# Patient Record
Sex: Female | Born: 1950 | Race: White | Hispanic: No
Health system: Midwestern US, Academic
[De-identification: ages and names within clinical notes are randomized; demographics above are authoritative.]

---

## 2016-07-04 MED ORDER — AMLODIPINE 5 MG PO TAB
ORAL_TABLET | Freq: Every day | 0 refills | Status: DC
Start: 2016-07-04 — End: 2018-06-16

## 2017-06-26 ENCOUNTER — Encounter: Admit: 2017-06-26 | Discharge: 2017-06-27 | Payer: MEDICARE

## 2017-06-26 DIAGNOSIS — R69 Illness, unspecified: Principal | ICD-10-CM

## 2017-07-10 ENCOUNTER — Encounter: Admit: 2017-07-10 | Discharge: 2017-07-10 | Payer: MEDICARE

## 2017-07-10 DIAGNOSIS — R69 Illness, unspecified: Principal | ICD-10-CM

## 2017-12-02 ENCOUNTER — Encounter: Admit: 2017-12-02 | Discharge: 2017-12-02 | Payer: MEDICARE

## 2018-04-08 ENCOUNTER — Encounter: Admit: 2018-04-08 | Discharge: 2018-04-09 | Payer: MEDICARE

## 2018-04-08 IMAGING — CR LOW_EXM
3 series · 3 of 3 positions shown · non-contrast
Comparison: none

[foot]
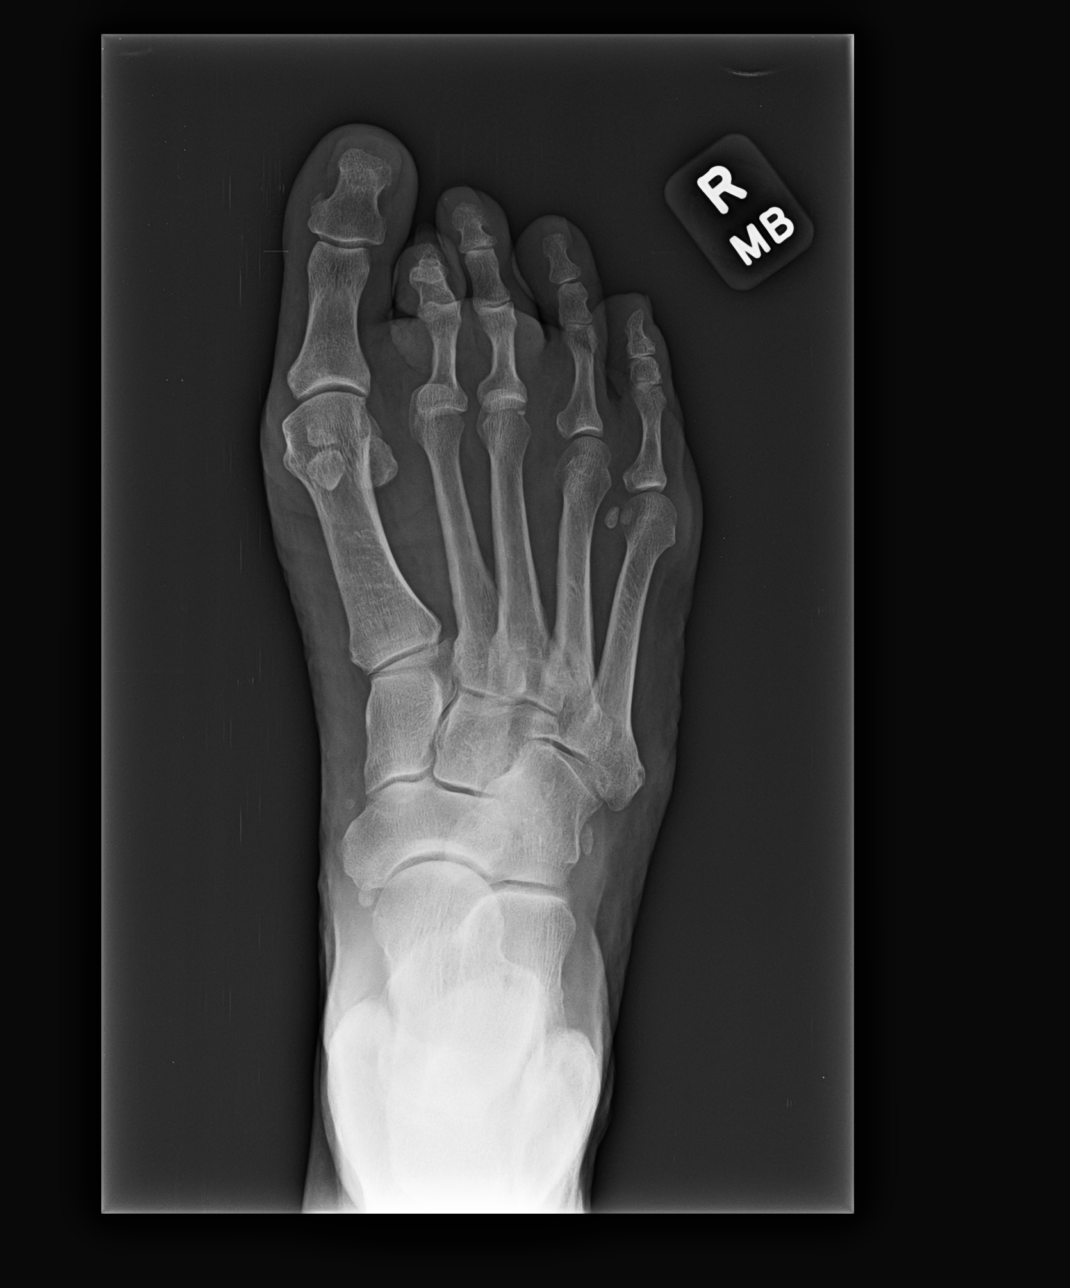

[foot obl]
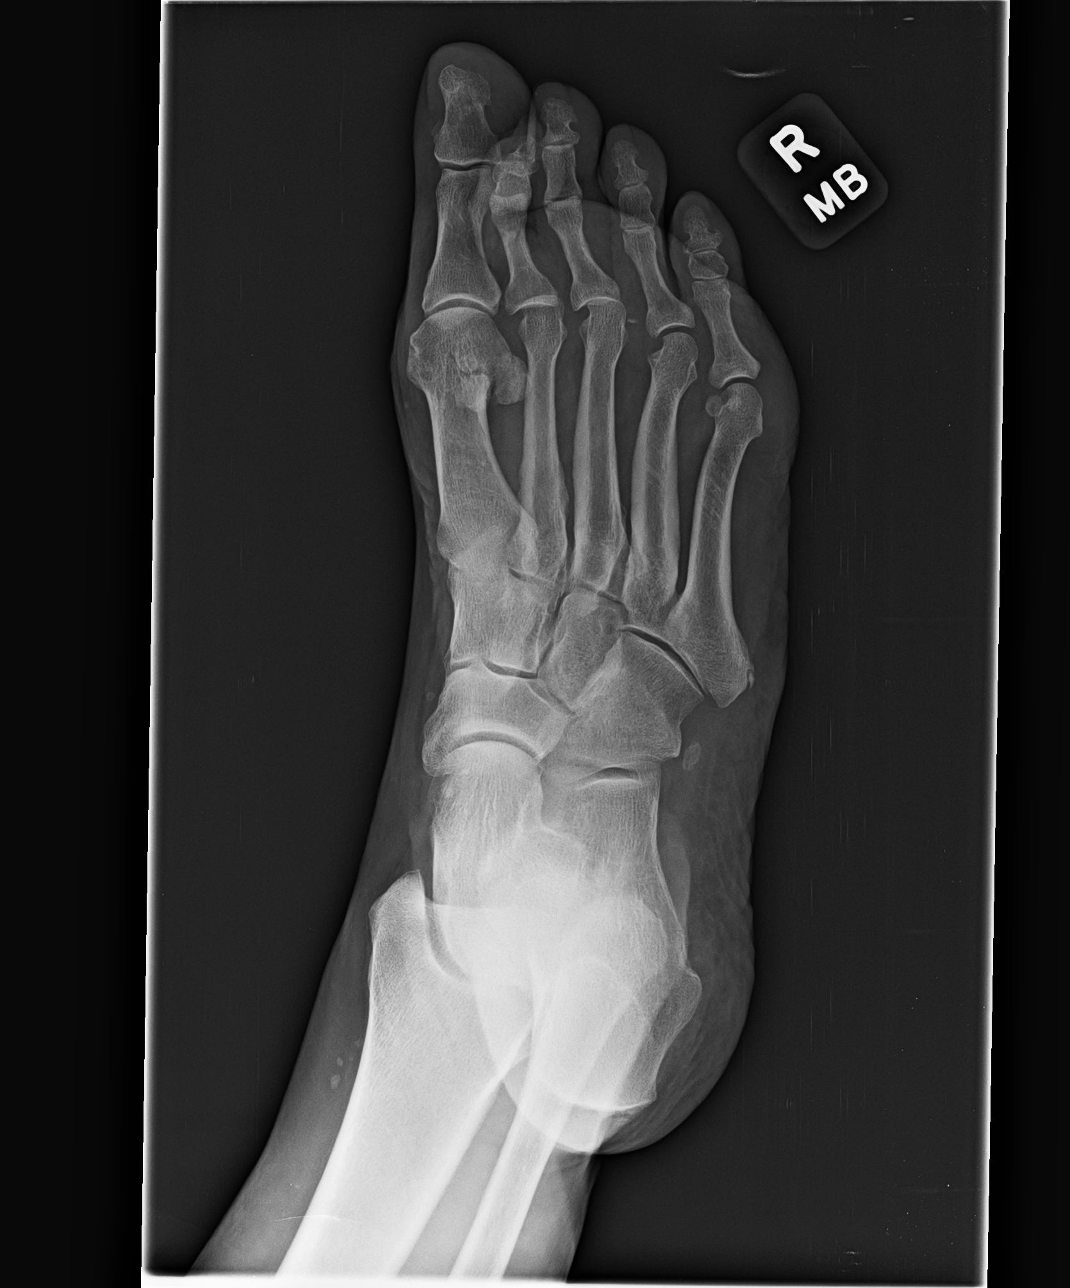

[foot lat]
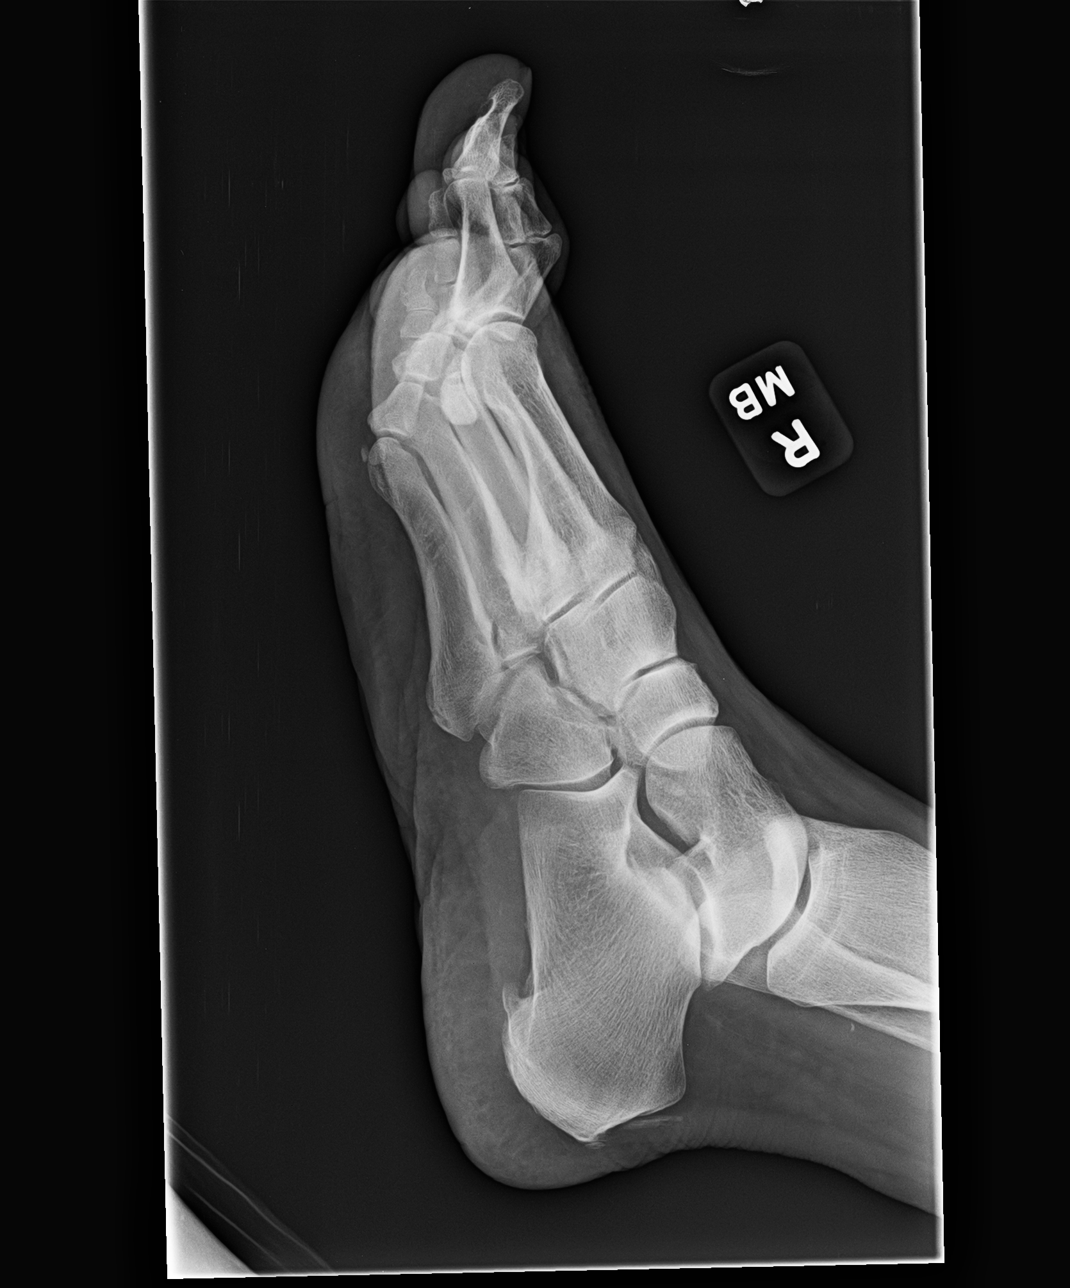

[3 of 3 positions shown; findings below may reference images not displayed]

EXAM
3 VIEW RIGHT FOOT

INDICATION
right foot pain
chronic right foot pain, hx of cortisone inj x1 year ago with noted toe changes since - MB/AK

TECHNIQUE
Frontal, oblique, and lateral radiographs of the right foot were submitted.

COMPARISONS
Right foot radiographs June 26, 2017.

FINDINGS
There is no acute fracture or malalignment. The joint spaces are preserved. No aggressive bone
lesion is identified. The Lisfranc interval is normal.Bipartite tibial sesamoid, bunionette, os
peroneum, an accessory navicular ossicle. Plantar and posterior calcaneal enthesophytes. Suggestion
of hammertoe deformities of the 2nd and 3rd toes on nonweightbearing imaging, correlate with
clinical exam.

IMPRESSION
1. No acute displaced fracture or malalignment.
2. Findings suggesting hammertoe deformities of 2nd and 3rd toes, correlate with clinical exam.
3. Calcaneal enthesophytes.

Tech Notes:

chronic right foot pain, hx of cortisone inj x1 year ago with noted toe changes since - MB/AK

## 2018-04-10 ENCOUNTER — Encounter: Admit: 2018-04-10 | Discharge: 2018-04-11 | Payer: MEDICARE

## 2018-04-22 ENCOUNTER — Encounter: Admit: 2018-04-22 | Discharge: 2018-04-22 | Payer: MEDICARE

## 2018-04-22 DIAGNOSIS — M79671 Pain in right foot: Principal | ICD-10-CM

## 2018-06-10 ENCOUNTER — Encounter: Admit: 2018-06-10 | Discharge: 2018-06-10 | Payer: MEDICARE

## 2018-06-16 ENCOUNTER — Ambulatory Visit: Admit: 2018-06-16 | Discharge: 2018-06-17 | Payer: MEDICARE

## 2018-06-16 NOTE — Progress Notes
???   ABSCESS DRAINAGE     ??? COLONOSCOPY     ??? HX TONSILLECTOMY     ??? HYSTERECTOMY         Social History     Socioeconomic History   ??? Marital status: Widowed     Spouse name: Not on file   ??? Number of children: 1   ??? Years of education: Not on file   ??? Highest education level: Not on file   Occupational History   ??? Occupation: retired    Tobacco Use   ??? Smoking status: Current Every Day Smoker     Packs/day: 1.00     Types: Cigarettes   ??? Smokeless tobacco: Never Used   Substance and Sexual Activity   ??? Alcohol use: No     Alcohol/week: 0.0 standard drinks   ??? Drug use: No   ??? Sexual activity: Not on file   Other Topics Concern   ??? Not on file   Social History Narrative   ??? Not on file       Family History   Problem Relation Age of Onset   ??? Parkinson's  Mother    ??? Heart problem Mother    ??? Alzheimer's Father    ??? Diabetes Sister    ??? Heart problem Brother    ??? Aneurysm Brother        Allergies:   Allergies   Allergen Reactions   ??? Pcn [Penicillins] HIVES   ??? Succinylcholine SEE COMMENTS     doesn't wake up with antidote         Outpatient Medications as of 06/16/2018   Medication Sig Dispense Refill   ??? escitalopram oxalate (LEXAPRO) 10 mg tablet Take 10 mg by mouth daily.     ??? HYDROcodone/acetaminophen (NORCO) 5/325 mg tablet Take 1 Tab by mouth as Needed for Pain     ??? LORazepam (ATIVAN) 0.5 mg tablet Take 1 Tab by mouth as Needed (Anxiety).     ??? meloxicam (MOBIC) 15 mg tablet Take 15 mg by mouth daily.     ??? omeprazole DR(+) (PRILOSEC) 20 mg capsule Take 20 mg by mouth daily before breakfast.           Review of Systems:  Musculoskeletal:positive for bone pain  Cardio: denies chest pain  Respiratory: denies shortness of breath     There were no vitals taken for this visit.    Based on my limited viewing of her foot she has hyperextension at the level of the MTP.  There is what looks to be a callus on the plantar aspect.  She is got some hammering of her second and third toes.

## 2018-06-17 DIAGNOSIS — M2061 Acquired deformities of toe(s), unspecified, right foot: Principal | ICD-10-CM

## 2018-06-18 ENCOUNTER — Encounter: Admit: 2018-06-18 | Discharge: 2018-06-18 | Payer: MEDICARE

## 2018-06-18 DIAGNOSIS — M2061 Acquired deformities of toe(s), unspecified, right foot: Principal | ICD-10-CM

## 2018-06-18 DIAGNOSIS — M79671 Pain in right foot: Principal | ICD-10-CM

## 2018-06-18 NOTE — Telephone Encounter
Ariel Lee agreed for surgery (right 2nd & 3rd head metatarsal head resection with RTS implants and possible 2 & 3rd PIP) to be scheduled for 06/24/2018. Surgery packet sent to e-mail [email or web address] with instructions outlined for pt's review. She was informed to bring crutches, walker or an ambulatory assistive device to surgery. This nurse discussed the need for a driver that will not be able to wait in the facility d/t COVID-19 protocol and the need to be tested on 06/22/2018 and have a negative result in order to proceed with surgery. She verbalized understanding. Ariel Lee was given the number to the COVID-19 81600 to schedule for testing.

## 2018-06-22 ENCOUNTER — Encounter: Admit: 2018-06-22 | Discharge: 2018-06-22 | Payer: MEDICARE

## 2018-06-22 ENCOUNTER — Ambulatory Visit: Admit: 2018-06-22 | Discharge: 2018-06-23 | Payer: MEDICARE

## 2018-06-22 DIAGNOSIS — F419 Anxiety disorder, unspecified: ICD-10-CM

## 2018-06-22 DIAGNOSIS — F329 Major depressive disorder, single episode, unspecified: Principal | ICD-10-CM

## 2018-06-22 DIAGNOSIS — Z01818 Encounter for other preprocedural examination: ICD-10-CM

## 2018-06-22 DIAGNOSIS — M1990 Unspecified osteoarthritis, unspecified site: ICD-10-CM

## 2018-06-23 ENCOUNTER — Encounter: Admit: 2018-06-23 | Discharge: 2018-06-23 | Payer: MEDICARE

## 2018-06-23 ENCOUNTER — Ambulatory Visit: Admit: 2018-06-23 | Discharge: 2018-06-23 | Payer: MEDICARE

## 2018-06-23 DIAGNOSIS — M79671 Pain in right foot: Principal | ICD-10-CM

## 2018-06-23 DIAGNOSIS — Z1159 Encounter for screening for other viral diseases: ICD-10-CM

## 2018-06-23 LAB — COVID-19 (SARS-COV-2) PCR

## 2018-06-23 NOTE — Telephone Encounter
Called patient and verified full name and date of birth. Informed patient of NEGATIVE COVID 19 testing. Patient was asked if they were still experiencing any symptoms and responded with no symptoms Patient was advised to contact PCP for ongoing symptoms. Pre-op testing. Patient had no further questions.

## 2018-06-24 ENCOUNTER — Encounter: Admit: 2018-06-24 | Discharge: 2018-06-24 | Payer: MEDICARE

## 2018-06-24 ENCOUNTER — Encounter: Admit: 2018-06-24 | Discharge: 2018-06-25 | Payer: MEDICARE

## 2018-06-24 ENCOUNTER — Ambulatory Visit: Admit: 2018-06-24 | Discharge: 2018-06-24 | Payer: MEDICARE

## 2018-06-24 DIAGNOSIS — Z880 Allergy status to penicillin: ICD-10-CM

## 2018-06-24 DIAGNOSIS — Z7982 Long term (current) use of aspirin: ICD-10-CM

## 2018-06-24 DIAGNOSIS — Z79899 Other long term (current) drug therapy: ICD-10-CM

## 2018-06-24 DIAGNOSIS — F419 Anxiety disorder, unspecified: ICD-10-CM

## 2018-06-24 DIAGNOSIS — M2041 Other hammer toe(s) (acquired), right foot: ICD-10-CM

## 2018-06-24 DIAGNOSIS — F17210 Nicotine dependence, cigarettes, uncomplicated: ICD-10-CM

## 2018-06-24 DIAGNOSIS — F329 Major depressive disorder, single episode, unspecified: ICD-10-CM

## 2018-06-24 DIAGNOSIS — M19071 Primary osteoarthritis, right ankle and foot: ICD-10-CM

## 2018-06-24 DIAGNOSIS — Z791 Long term (current) use of non-steroidal anti-inflammatories (NSAID): ICD-10-CM

## 2018-06-24 DIAGNOSIS — S93124A Dislocation of metatarsophalangeal joint of right lesser toe(s), initial encounter: Principal | ICD-10-CM

## 2018-06-24 DIAGNOSIS — M1990 Unspecified osteoarthritis, unspecified site: ICD-10-CM

## 2018-06-24 MED ORDER — CEFAZOLIN 1 GRAM IJ SOLR
0 refills | Status: DC
Start: 2018-06-24 — End: 2018-06-24
  Administered 2018-06-24: 15:00:00 2 g via INTRAVENOUS

## 2018-06-24 MED ORDER — MEPIVACAINE 2 % IJ SOLN
0 refills | Status: DC
Start: 2018-06-24 — End: 2018-06-24
  Administered 2018-06-24: 14:00:00 10 mL
  Administered 2018-06-24: 14:00:00 5 mL

## 2018-06-24 MED ORDER — PROPOFOL 10 MG/ML IV EMUL 20 ML (INFUSION)(AM)(OR)
INTRAVENOUS | 0 refills | Status: DC
Start: 2018-06-24 — End: 2018-06-24
  Administered 2018-06-24: 15:00:00 60 ug via INTRAVENOUS
  Administered 2018-06-24: 14:00:00 50 ug/kg/min via INTRAVENOUS

## 2018-06-24 MED ORDER — DEXAMETHASONE SODIUM PHOSPHATE 10 MG/ML IJ SOLN
0 refills | Status: CP
Start: 2018-06-24 — End: ?
  Administered 2018-06-24: 14:00:00 2 mg

## 2018-06-24 MED ORDER — LACTATED RINGERS IV SOLP
INTRAVENOUS | 0 refills | Status: DC
Start: 2018-06-24 — End: 2018-06-24
  Administered 2018-06-24: 13:00:00 1000.000 mL via INTRAVENOUS

## 2018-06-24 MED ORDER — HALOPERIDOL LACTATE 5 MG/ML IJ SOLN
1 mg | Freq: Once | INTRAVENOUS | 0 refills | Status: DC | PRN
Start: 2018-06-24 — End: 2018-06-24

## 2018-06-24 MED ORDER — HYDROCODONE-ACETAMINOPHEN 5-325 MG PO TAB
1-2 | ORAL_TABLET | ORAL | 0 refills | 15.00000 days | Status: DC | PRN
Start: 2018-06-24 — End: 2018-07-14
  Filled 2018-06-24 (×2): qty 50, 7d supply, fill #1

## 2018-06-24 MED ORDER — BUPIVACAINE 0.5 % (5 MG/ML) IJ SOLN
0 refills | Status: CP
Start: 2018-06-24 — End: ?
  Administered 2018-06-24: 14:00:00 10 mL

## 2018-06-24 MED ORDER — OXYCODONE 5 MG PO TAB
5-10 mg | Freq: Once | ORAL | 0 refills | Status: DC | PRN
Start: 2018-06-24 — End: 2018-06-24

## 2018-06-24 MED ORDER — LIDOCAINE (PF) 10 MG/ML (1 %) IJ SOLN
.1-2 mL | INTRAMUSCULAR | 0 refills | Status: DC | PRN
Start: 2018-06-24 — End: 2018-06-24

## 2018-06-24 MED ORDER — FENTANYL CITRATE (PF) 50 MCG/ML IJ SOLN
25 ug | INTRAVENOUS | 0 refills | Status: DC | PRN
Start: 2018-06-24 — End: 2018-06-24

## 2018-06-24 MED ORDER — ACETAMINOPHEN 500 MG PO TAB
1000 mg | Freq: Once | ORAL | 0 refills | Status: CP
Start: 2018-06-24 — End: ?
  Administered 2018-06-24: 16:00:00 1000 mg via ORAL

## 2018-06-24 MED ORDER — HYDROMORPHONE (PF) 2 MG/ML IJ SYRG
1 mg | INTRAVENOUS | 0 refills | Status: DC | PRN
Start: 2018-06-24 — End: 2018-06-24

## 2018-06-24 MED ORDER — PROMETHAZINE 25 MG/ML IJ SOLN
6.25 mg | INTRAVENOUS | 0 refills | Status: DC | PRN
Start: 2018-06-24 — End: 2018-06-24

## 2018-06-24 MED ORDER — ONDANSETRON 4 MG PO TBDI
4-8 mg | ORAL_TABLET | ORAL | 1 refills | 8.00000 days | Status: DC | PRN
Start: 2018-06-24 — End: 2019-06-29
  Filled 2018-06-24 (×2): qty 30, 5d supply, fill #1

## 2018-06-24 MED ORDER — FENTANYL CITRATE (PF) 50 MCG/ML IJ SOLN
0 refills | Status: DC
Start: 2018-06-24 — End: 2018-06-24
  Administered 2018-06-24 (×2): 25 ug via INTRAVENOUS

## 2018-06-24 MED ORDER — BUPIVACAINE 0.5 % (5 MG/ML) IJ SOLN
0 refills | Status: CP
Start: 2018-06-24 — End: ?
  Administered 2018-06-24: 14:00:00 5 mL

## 2018-06-24 MED ORDER — BUPIVACAINE 0.5 % (5 MG/ML) IJ SOLN
0 refills | Status: DC
Start: 2018-06-24 — End: 2018-06-24

## 2018-06-24 MED ORDER — MIDAZOLAM 1 MG/ML IJ SOLN
INTRAVENOUS | 0 refills | Status: DC
Start: 2018-06-24 — End: 2018-06-24
  Administered 2018-06-24 (×2): 1 mg via INTRAVENOUS

## 2018-06-24 MED ORDER — MEPERIDINE (PF) 25 MG/ML IJ SYRG
12.5 mg | INTRAVENOUS | 0 refills | Status: DC | PRN
Start: 2018-06-24 — End: 2018-06-24

## 2018-06-24 MED ORDER — FENTANYL CITRATE (PF) 50 MCG/ML IJ SOLN
50 ug | INTRAVENOUS | 0 refills | Status: DC | PRN
Start: 2018-06-24 — End: 2018-06-24

## 2018-06-25 NOTE — Progress Notes
PATIENT HAS CALL INTO DR OFFICE R/T NORCO MAY NOT BE STRONG ENOUGH AND SURGICAL DRESSING FELL OFF DURING THE NIGHT. SHE PUT THE DRSG BACK ON AS BEST AS SHE COULD

## 2018-06-26 ENCOUNTER — Encounter: Admit: 2018-06-26 | Discharge: 2018-06-26 | Payer: MEDICARE

## 2018-06-26 DIAGNOSIS — F419 Anxiety disorder, unspecified: ICD-10-CM

## 2018-06-26 DIAGNOSIS — M1990 Unspecified osteoarthritis, unspecified site: ICD-10-CM

## 2018-06-26 DIAGNOSIS — F329 Major depressive disorder, single episode, unspecified: Principal | ICD-10-CM

## 2018-06-26 NOTE — Other
Brief Operative Note    Name: Ariel Lee is a 68 y.o. 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     Specimen(s) Removed/Disposition: * No specimens in log *    Complications:  None    Implants: size 2 RTS implants, acutwist x 2    Drains: None    Disposition:  PACU - stable    Ree Shay, MD  Pager [phone]

## 2018-06-30 ENCOUNTER — Ambulatory Visit: Admit: 2018-06-30 | Discharge: 2018-07-01 | Payer: MEDICARE

## 2018-06-30 ENCOUNTER — Encounter: Admit: 2018-06-30 | Discharge: 2018-06-30 | Payer: MEDICARE

## 2018-06-30 DIAGNOSIS — M1990 Unspecified osteoarthritis, unspecified site: ICD-10-CM

## 2018-06-30 DIAGNOSIS — F329 Major depressive disorder, single episode, unspecified: Principal | ICD-10-CM

## 2018-06-30 DIAGNOSIS — F419 Anxiety disorder, unspecified: ICD-10-CM

## 2018-07-01 DIAGNOSIS — Z09 Encounter for follow-up examination after completed treatment for conditions other than malignant neoplasm: Principal | ICD-10-CM

## 2018-07-14 ENCOUNTER — Encounter: Admit: 2018-07-14 | Discharge: 2018-07-14 | Payer: MEDICARE

## 2018-07-14 DIAGNOSIS — M1990 Unspecified osteoarthritis, unspecified site: ICD-10-CM

## 2018-07-14 DIAGNOSIS — F419 Anxiety disorder, unspecified: ICD-10-CM

## 2018-07-14 DIAGNOSIS — F329 Major depressive disorder, single episode, unspecified: Principal | ICD-10-CM

## 2018-07-15 ENCOUNTER — Ambulatory Visit: Admit: 2018-07-14 | Discharge: 2018-07-15 | Payer: MEDICARE

## 2018-07-15 DIAGNOSIS — Z09 Encounter for follow-up examination after completed treatment for conditions other than malignant neoplasm: Principal | ICD-10-CM

## 2018-08-11 ENCOUNTER — Encounter: Admit: 2018-08-11 | Discharge: 2018-08-11

## 2018-08-12 ENCOUNTER — Encounter: Admit: 2018-08-12 | Discharge: 2018-08-12

## 2018-08-12 MED ORDER — CEPHALEXIN 500 MG PO CAP
500 mg | ORAL_CAPSULE | Freq: Four times a day (QID) | ORAL | 0 refills | Status: DC
Start: 2018-08-12 — End: 2019-03-09

## 2018-08-12 NOTE — Telephone Encounter
Received call from Ms. Detlefsen regarding her 2nd toe on right foot. She report redness and swelling. She denies fever and chills. Keflex 500mg  qid x10 days e-scripted per protocol. She was instructed to return call in 3-4 days if swelling/redness does not improve or if she starts to experience fever and or chills. She verbalized understanding.

## 2018-08-24 ENCOUNTER — Encounter: Admit: 2018-08-24 | Discharge: 2018-08-24

## 2018-08-24 NOTE — Progress Notes
08/24/2018 5:13 PM Received historical labs from [facility].  Entered into EMR.

## 2018-08-25 ENCOUNTER — Encounter: Admit: 2018-08-25 | Discharge: 2018-08-25

## 2018-08-25 ENCOUNTER — Ambulatory Visit: Admit: 2018-08-25 | Discharge: 2018-08-26

## 2018-08-25 DIAGNOSIS — F329 Major depressive disorder, single episode, unspecified: Secondary | ICD-10-CM

## 2018-08-25 DIAGNOSIS — R6884 Jaw pain: Secondary | ICD-10-CM

## 2018-08-25 DIAGNOSIS — F419 Anxiety disorder, unspecified: Secondary | ICD-10-CM

## 2018-08-25 DIAGNOSIS — I10 Essential (primary) hypertension: Secondary | ICD-10-CM

## 2018-08-25 DIAGNOSIS — R011 Cardiac murmur, unspecified: Secondary | ICD-10-CM

## 2018-08-25 DIAGNOSIS — R0600 Dyspnea, unspecified: Secondary | ICD-10-CM

## 2018-08-25 DIAGNOSIS — M1990 Unspecified osteoarthritis, unspecified site: Secondary | ICD-10-CM

## 2018-08-25 NOTE — Patient Instructions
Taking Your Blood Pressure  Blood pressure is the force of blood against the artery wall as it moves from the heart through the blood vessels. You can take your own blood pressure reading using a digital monitor. Take your readings the same each time, using the same arm. Take readings as often as your healthcare provider advises.  About blood pressure monitors  Blood pressure monitors are designed for certain ages and cases. You can find monitors for older adults, for pregnant women, and for children. Make sure the one you choose is the right one for your age and situation.  The American Heart Association advises an automatic cuff monitor that fits on your upper arm (bicep). The cuff should fit your arm size. A cuff that???s too large or too small will not give an accurate reading. Measure around your upper arm to find your size.  Monitors that attach to your finger or wrist are not as accurate as monitors for your upper arm.  Ask your healthcare provider for help in choosing a monitor. Bring your monitor to your next provider visit if you need help in using it the correct way.  The steps below are general instructions for using an automatic digital monitor.  Step 1. Relax    ??? Take your blood pressure at the same time every day, such as in the morning or evening. Or take it at the time your healthcare provider advises.  ??? Wait at least30 minutes after smoking, eating, or exercising. Don't drink coffee, tea, soda, or other caffeinated drinks before checking your blood pressure. If needed, use the restroom beforehand.  ??? Sit comfortably at a table with both feet on the floor. Don't cross your legs or feet. Place the monitor near you.  ??? Rest for a few minutes before you begin. Make sure there are no distractions. This includes TV, cell phones, and other electronics. Wait to have conversations with others until after you measure you blood pressure.  Step 2. Wrap the cuff ??? Place your arm on the table, palm up. Your arm should be at the level of your heart. Wrap the cuff around your upper arm, just above your elbow. It???s best done on bare skin, not over clothing. Most cuffs will show you where the blood vessel in the middle of the arm at the inner side of the elbow (the brachial artery) should line up with the cuff. Look in your monitor's instruction booklet for an illustration. You can also bring your cuff to your healthcare provider and have them show you how to correctly place the cuff.  Step 3. Inflate the cuff    ??? Push the button that starts the pump.  ??? The cuff will tighten, then loosen.  ??? The numbers will change. When they stop changing, your blood pressure reading will appear.  ??? Take 2 or 3 readings 1 minute apart.  Step 4. Write down the results of each reading    ??? Write down your blood pressure numbers for each reading. Note the date and time. Keep your results in one place, such as a notebook. Even if your monitor has a built-in memory, keep a hard copy of the readings.  ??? Remove the cuff from your arm. Turn off the machine.  ??? Bring your blood pressure records with you to each healthcare provider visit.  ??? If you start a new blood pressure medicine, note the day you started the new medicine. Also note the day if you change the dose  of your medicine. Measure your blood pressure before your take your medicine. This information goes on your blood pressure recording sheet. This will help your healthcare provider check how well the medicine changes are working.  ??? Ask your provider what numbers mean that you should call him or her. Also ask what numbers mean you should get help right away.  [employer] last reviewed this educational content on 08/18/2017  ??? 2000-2020 [employer], [place]. [address] [place], [place] [address]. All rights reserved. This information is not intended as a substitute for professional medical care. Always follow your healthcare professional's instructions.  Follow up as directed.    Call sooner if issues.    Call the [facility] nursing line at [phone].    Leave a detailed message for the nurse in Ariel Lee/[place] with how we can assist you and we will call you back.

## 2018-08-25 NOTE — Progress Notes
Date of Service: 08/25/2018    Ariel Lee is a 68 y.o. female.       HPI     Ariel Lee has been followed for hypertension.  She reports being told that her blood pressure is elevated when she sees a physician.  However, her blood pressure is very well controlled in the office today.  She has had several orthopedic procedures in the past year.  On Jun 24, 2018 she underwent right 2nd metatarsal head excision., right 3rd metatarsal head excision and right 2nd proximal interphalangeal joint fusion for correction of  right foot dislocated 2nd and 3rd metatarsophalangeal joints, metatarsophalangeal arthrosis, and 2nd hammertoe deformity.  She appears to have some infection near her right second toe is currently receiving Keflex with improvement.  In January 2020 she underwent left knee arthroplasty and in February 2020 she underwent right knee arthroplasty.  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  Otherwise from a cardiovascular perspective, the patient has been stable and reports no angina, congestive symptoms, palpitations, sensation of sustained forceful heart pounding, lightheadedness or syncope. Her exercise tolerance has been stable. The patient reports no myalgias, bleeding abnormalities, neurologic motor abnormalities or difficulty with speech.        Vitals:    08/25/18 1345 08/25/18 1406 08/25/18 1407   BP:  108/76 108/76   BP Source:  Arm, Left Upper Arm, Right Upper   Pulse:  86    SpO2:  97%    Weight:  97.4 kg (214 lb 12.8 oz)    Height:  1.676 m (5' 6)    PainSc: Zero Zero      Body mass index is 34.67 kg/m???.     Past Medical History  Patient Active Problem List    Diagnosis Date Noted   ??? Poorly-controlled hypertension 05/16/2015   ??? Dyspnea 05/16/2015   ??? Jaw pain 05/16/2015   ??? Tobacco abuse 05/16/2015   ??? Osteoarthritis of spine with radiculopathy, cervical region 05/04/2015 Review of Systems   Constitution: Negative.   HENT: Negative.    Eyes: Positive for blurred vision and vision loss in left eye.   Cardiovascular: Negative.    Respiratory: Positive for snoring.    Endocrine: Negative.    Hematologic/Lymphatic: Negative.    Skin: Negative.    Musculoskeletal: Positive for arthritis.   Gastrointestinal: Negative.    Genitourinary: Negative.    Neurological: Negative.    Psychiatric/Behavioral: Negative.    Allergic/Immunologic: Negative.        Physical Exam  GENERAL: The patient is well developed, well nourished, resting comfortably and in no distress.   HEENT: No abnormalities of the visible oro-nasopharynx, conjunctiva or sclera are noted.  NECK: There is no jugular venous distension. Carotids are palpable and without bruits. There is no thyroid enlargement.  Chest: Lung fields are clear to auscultation. There are no wheezes or crackles.  CV: There is a regular rhythm. The first and second heart sounds are normal.  A grade 3/6 systolic ejection murmur is heard loudest in the right upper sternal border.  There are no diastolic murmurs, gallops or rubs.  ABD: The abdomen is soft and supple with normal bowel sounds. There is no hepatosplenomegaly, ascites, tenderness, masses or bruits.  Neuro: There are no focal motor defects. Ambulation is normal. Cognitive function appears normal.  Ext: There is no edema or evidence of deep vein thrombosis. Peripheral pulses  are satisfactory.  Improving incision near the right second metatarsal.  SKIN: There are no rashes and no cellulitis  PSYCH: The patient is calm, rationale and oriented.    Cardiovascular Studies  A twelve-lead ECG was obtained on 08/25/2018 and reveals normal sinus rhythm with a heart rate of 81 bpm.  Multiple premature supraventricular and ventricular ectopic beats are noted.  There is no evidence of myocardial ischemia or infarction.    Problems Addressed Today  Hypertension.  Systolic murmur.    Assessment and Plan Ariel Lee reports episodic hypertension.  However, is difficult for me to treat her with her blood pressure low today.  She was instructed to obtain a [employer] blood pressure cuff and to monitor her blood pressure daily. I have asked the patient to keep a log book of her BP readings and to report systolic BP readings exceeding 140 mm Hg.  I have asked her to obtain an echo Doppler study for further evaluation of her systolic murmur. I have asked her to return for follow-up in 3 months time to review her blood pressure readings.         Current Medications (including today's revisions)  ??? aspirin EC 81 mg tablet Take 81 mg by mouth at bedtime daily. Take with food.    ??? cephalexin (KEFLEX) 500 mg capsule Take one capsule by mouth four times daily.   ??? escitalopram oxalate (LEXAPRO) 10 mg tablet Take 10 mg by mouth at bedtime daily.   ??? LORazepam (ATIVAN) 0.5 mg tablet Take 1 Tab by mouth as Needed (Anxiety).   ??? meloxicam (MOBIC) 15 mg tablet Take 15 mg by mouth at bedtime daily.   ??? omeprazole DR(+) (PRILOSEC) 20 mg capsule Take 20 mg by mouth at bedtime daily.   ??? ondansetron (ZOFRAN ODT) 4 mg rapid dissolve tablet Dissolve one tablet to two tablets by mouth every 8 hours as needed for Nausea or Vomiting. Place on tongue to dissolve.

## 2018-09-09 ENCOUNTER — Ambulatory Visit: Admit: 2018-09-09 | Discharge: 2018-09-10

## 2018-09-09 ENCOUNTER — Encounter: Admit: 2018-09-09 | Discharge: 2018-09-09

## 2018-09-09 DIAGNOSIS — R0600 Dyspnea, unspecified: Secondary | ICD-10-CM

## 2018-09-09 DIAGNOSIS — R011 Cardiac murmur, unspecified: Secondary | ICD-10-CM

## 2018-09-09 DIAGNOSIS — I10 Essential (primary) hypertension: Secondary | ICD-10-CM

## 2018-09-09 DIAGNOSIS — R6884 Jaw pain: Secondary | ICD-10-CM

## 2018-09-10 ENCOUNTER — Encounter: Admit: 2018-09-10 | Discharge: 2018-09-10

## 2018-09-10 NOTE — Telephone Encounter
-----   Message from Nehemiah Massed, MD sent at 09/10/2018  9:34 AM CDT -----  Sherlon Handing: Ms. Collister's echo looks good.  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  Please let her know.  Thanks.  SBG  ----- Message -----  From: Nehemiah Massed, MD  Sent: 09/10/2018   9:18 AM CDT  To: Nehemiah Massed, MD

## 2018-09-10 NOTE — Telephone Encounter
Results and recommendations called to patient.

## 2018-12-08 ENCOUNTER — Encounter: Admit: 2018-12-08 | Discharge: 2018-12-08 | Payer: MEDICARE

## 2018-12-08 DIAGNOSIS — F329 Major depressive disorder, single episode, unspecified: Secondary | ICD-10-CM

## 2018-12-08 DIAGNOSIS — I10 Essential (primary) hypertension: Secondary | ICD-10-CM

## 2018-12-08 DIAGNOSIS — R0600 Dyspnea, unspecified: Secondary | ICD-10-CM

## 2018-12-08 DIAGNOSIS — M1990 Unspecified osteoarthritis, unspecified site: Secondary | ICD-10-CM

## 2018-12-08 DIAGNOSIS — F419 Anxiety disorder, unspecified: Secondary | ICD-10-CM

## 2018-12-08 LAB — BASIC METABOLIC PANEL
Lab: 0.8
Lab: 104
Lab: 137
Lab: 22 — ABNORMAL HIGH (ref 9.8–20.1)
Lab: 23
Lab: 4.5

## 2018-12-08 MED ORDER — AMLODIPINE 2.5 MG PO TAB
2.5 mg | ORAL_TABLET | Freq: Every day | ORAL | 3 refills | Status: AC
Start: 2018-12-08 — End: ?

## 2018-12-08 NOTE — Progress Notes
Date of Service: 12/08/2018    Ariel Lee is a 68 y.o. female.       HPI     Ariel Lee has been followed for hypertension.  Her systolic blood pressures are currently running 130 to 140 mmHg. 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  Otherwise from a cardiovascular perspective, the patient has been stable and reports no angina, congestive symptoms, palpitations, sensation of sustained forceful heart pounding, lightheadedness or syncope. Her exercise tolerance has been stable, although she does not have a regular exercise program. The patient reports no myalgias, bleeding abnormalities, neurologic motor abnormalities or difficulty with speech.   Ariel Lee reports that she has been intolerant of several statin medications in the past due to myalgias.  Historically, Ariel Lee had several orthopedic procedures in 2020.  In January 2020 she underwent left knee arthroplasty and in February 2020 she underwent right knee arthroplasty.  On Jun 24, 2018 she underwent right 2nd metatarsal head excision., right 3rd metatarsal head excision and right 2nd proximal interphalangeal joint fusion for correction of  right foot dislocated 2nd and 3rd metatarsophalangeal joints, metatarsophalangeal arthrosis, and 2nd hammertoe deformity.        Vitals:    12/08/18 1311 12/08/18 1325   BP: 138/88 136/86   BP Source: Arm, Left Upper Arm, Right Upper   Pulse: 80    Temp: 36.8 ?C (98.2 ?F)    SpO2: 98%    Weight: 96 kg (211 lb 9.6 oz)    Height: 1.676 m (5' 6)    PainSc: Zero      Body mass index is 34.15 kg/m?[name]     Past Medical History  Patient Active Problem List    Diagnosis Date Noted   ? Poorly-controlled hypertension 05/16/2015   ? Dyspnea 05/16/2015   ? Jaw pain 05/16/2015   ? Tobacco abuse 05/16/2015   ? 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   HENT: Negative.    Eyes: Negative.    Cardiovascular: Negative.    Respiratory: Negative.    Endocrine: Negative.    Hematologic/Lymphatic: Negative.    Skin: Negative.    Musculoskeletal: Negative.    Gastrointestinal: Negative.    Genitourinary: Negative.    Neurological: Negative.    Psychiatric/Behavioral: Negative.    Allergic/Immunologic: Negative.        Physical Exam  GENERAL: The patient is well developed, well nourished, resting comfortably and in no distress.   HEENT: No abnormalities of the visible oro-nasopharynx, conjunctiva or sclera are noted.  NECK: There is no jugular venous distension. Carotids are palpable and without bruits. There is no thyroid enlargement.  Chest: Lung fields are clear to auscultation. There are no wheezes or crackles.  CV: There is a regular rhythm. The first and second heart sounds are normal.  A grade 3/6 systolic ejection murmur is heard loudest in the right upper sternal border.  There are no diastolic murmurs, gallops or rubs.  Her apical heart rate is 80 bpm.  ABD: The abdomen is soft and supple with normal bowel sounds. There is no hepatosplenomegaly, ascites, tenderness, masses or bruits.  Neuro: There are no focal motor defects. Ambulation is normal. Cognitive function appears normal.  Ext: There is no edema or evidence of deep vein thrombosis. Peripheral pulses are satisfactory.  Improving incision near  the right second metatarsal.  SKIN: There are no rashes and no cellulitis  PSYCH: The patient is calm, rationale and oriented.    Cardiovascular Studies  A twelve-lead ECG was obtained on 08/25/2018 and reveals normal sinus rhythm with a heart rate of 81 bpm.  Multiple premature supraventricular and ventricular ectopic beats are noted.  There is no evidence of myocardial ischemia or infarction.    Echo Doppler 09/09/2018:  1. No regional wall motion abnormalities are seen. Overall LV systolic function appears normal and dynamic. The estimated left ventricular ejection fraction is 65%.   2. Normal left ventricular diastolic function.   3. Right ventricular contractility appears normal.  4. Normal atrial chamber dimensions.  5. 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 There is no evidence of significant valvular regurgitation or stenosis by doppler exam.  6. No pericardial effusion is seen.    Labs from 02/09/2018 revealed total cholesterol 236, triglycerides 125, HDL 47 LDL cholesterol 172 mg/dL.    Problems Addressed Today  Encounter Diagnoses   Name Primary?   ? Poorly-controlled hypertension Yes   ? Dyspnea, unspecified type        Assessment and Plan     Ariel Lee has mild hypertension.  Alternatives for treatment were reviewed with the patient and she wanted to start amlodipine 2.5 mg daily.  She reports that she has taken this medication before without adverse effects. I have asked the patient to keep a log book of her BP readings and to report BP readings exceeding 130/80 mm Hg. Regular mild aerobic exercise, weight loss and adherence to a heart healthy diet were recommended. The patient was given a requisition to check her Chem 7.  I have asked her to return for follow-up in 3 months to review her blood pressure readings.          Current Medications (including today's revisions)  ? amLODIPine (NORVASC) 2.5 mg tablet Take one tablet by mouth daily.   ? aspirin EC 81 mg tablet Take 81 mg by mouth at bedtime daily. Take with food.    ? cephalexin (KEFLEX) 500 mg capsule Take one capsule by mouth four times daily.   ? escitalopram oxalate (LEXAPRO) 10 mg tablet Take 10 mg by mouth at bedtime daily.   ? LORazepam (ATIVAN) 0.5 mg tablet Take 1 Tab by mouth as Needed (Anxiety).   ? meloxicam (MOBIC) 15 mg tablet Take 15 mg by mouth at bedtime daily.   ? omeprazole DR(+) (PRILOSEC) 20 mg capsule Take 20 mg by mouth at bedtime daily. ? ondansetron (ZOFRAN ODT) 4 mg rapid dissolve tablet Dissolve one tablet to two tablets by mouth every 8 hours as needed for Nausea or Vomiting. Place on tongue to dissolve.

## 2018-12-08 NOTE — Patient Instructions
Thank you for visiting our office today.    We would like to make the following medication adjustments:   Start amlodipine 2.5 mg daily.      Otherwise continue the same medications as you have been doing.          We will be pursuing the following tests after your appointment today:       Orders Placed This Encounter    BASIC METABOLIC PANEL    amLODIPine (NORVASC) 2.5 mg tablet         We will plan to see you back in 3 months.  Please call us in the meantime with any questions or concerns.        Please allow 5-7 business days for our providers to review your results. All normal results will go to MyChart. If you do not have Mychart, it is strongly recommended to get this so you can easily view all your results. If you do not have mychart, we will attempt to call you once with normal lab and testing results. If we cannot reach you by phone with normal results, we will send you a letter.  If you have not heard the results of your testing after one week please give [place] a call.       Your Cardiovascular Medicine [place] Team Richardson Landry, Rene Kocher and [place])  phone number is [phone].

## 2019-03-09 ENCOUNTER — Encounter: Admit: 2019-03-09 | Discharge: 2019-03-09 | Payer: MEDICARE

## 2019-03-09 DIAGNOSIS — R0600 Dyspnea, unspecified: Secondary | ICD-10-CM

## 2019-03-09 DIAGNOSIS — E785 Hyperlipidemia, unspecified: Secondary | ICD-10-CM

## 2019-03-09 DIAGNOSIS — F329 Major depressive disorder, single episode, unspecified: Secondary | ICD-10-CM

## 2019-03-09 DIAGNOSIS — I10 Essential (primary) hypertension: Secondary | ICD-10-CM

## 2019-03-09 DIAGNOSIS — F419 Anxiety disorder, unspecified: Secondary | ICD-10-CM

## 2019-03-09 DIAGNOSIS — R7401 ALT (SGPT) level raised: Secondary | ICD-10-CM

## 2019-03-09 DIAGNOSIS — M1990 Unspecified osteoarthritis, unspecified site: Secondary | ICD-10-CM

## 2019-03-09 MED ORDER — ROSUVASTATIN 5 MG PO TAB
5 mg | ORAL_TABLET | Freq: Every day | ORAL | 3 refills | 90.00000 days | Status: DC
Start: 2019-03-09 — End: 2019-05-31

## 2019-03-09 NOTE — Progress Notes
Date of Service: 03/09/2019    Ariel Lee is a 69 y.o. female.       HPI     Ariel Lee has been followed for hypertension.   When I saw her in October 2020 she was hypertensive and amlodipine 2.5 mg daily was started.  This has controlled her blood pressure very well without any adverse effects.  Her blood pressure has been running less than 130/80 mmHg most of the time.  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 ?Otherwise from a cardiovascular perspective, the patient has been?stable?and reports no angina, congestive symptoms, palpitations, sensation of sustained forceful heart pounding, lightheadedness or syncope. Her exercise tolerance has been stable, although she does not have a regular exercise program. The patient reports no myalgias, bleeding abnormalities, neurologic motor abnormalities or difficulty with speech.?  Ariel Lee reports that she has been intolerant of several statin medications in the past due to myalgias.  Historically, Ariel Lee had several orthopedic procedures in 2020. ?In January 2020 she underwent left knee arthroplasty and in February 2020 she underwent right knee arthroplasty. ?On Jun 24, 2018 she underwent right 2nd metatarsal head excision., right 3rd metatarsal head excision?and right 2nd proximal interphalangeal joint fusion?for?correction of??right foot dislocated 2nd and 3rd metatarsophalangeal joints, metatarsophalangeal arthrosis, and?2nd hammertoe deformity.?         Vitals:    03/09/19 1521 03/09/19 1529   BP: 112/70 118/76   BP Source: Arm, Left Upper Arm, Right Upper   Patient Position: Sitting Sitting   Pulse: 72    Temp: 36.8 ?C (98.2 ?F)    TempSrc: Oral    SpO2: 98%    Weight: 97.1 kg (214 lb)    Height: 1.676 m (5' 6)    PainSc: Zero      Body mass index is 34.54 kg/m?Ariel Lee     Past Medical History  Patient Active Problem List    Diagnosis Date Noted ? Poorly-controlled hypertension 05/16/2015   ? Dyspnea 05/16/2015   ? Jaw pain 05/16/2015   ? Tobacco abuse 05/16/2015   ? 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   HENT: Negative.    Eyes: Negative.    Cardiovascular: Negative.    Respiratory: Negative.    Endocrine: Negative.    Hematologic/Lymphatic: Negative.    Skin: Negative.    Musculoskeletal: Positive for arthritis and back pain.   Gastrointestinal: Negative.    Genitourinary: Negative.    Neurological: Negative.    Psychiatric/Behavioral: Negative.    Allergic/Immunologic: Negative.        Physical Exam  GENERAL: The patient is well developed, well nourished, resting comfortably and in no distress.   HEENT: No abnormalities of the visible oro-nasopharynx, conjunctiva or sclera are noted.  NECK: There is no jugular venous distension. Carotids are palpable and without bruits. There is no thyroid enlargement.  Chest: Lung fields are clear to auscultation. There are no wheezes or crackles.  CV: There is a regular rhythm. The first and second heart sounds are normal.??A grade 3/6 systolic ejection murmur is heard loudest in the right upper sternal border. ?There are no?diastolic?murmurs, gallops or rubs.  Her apical heart rate is 80 bpm.  ABD: The abdomen is soft and supple with normal bowel sounds. There is no hepatosplenomegaly, ascites, tenderness, masses or bruits.  Neuro: There  are no focal motor defects. Ambulation is normal. Cognitive function appears normal.  Ext:?There is no edema or evidence of deep vein thrombosis. Peripheral pulses are satisfactory. ?Improving incision near the right second metatarsal.  SKIN:?There are no rashes and no cellulitis  PSYCH:?The patient is calm, rationale and oriented.    Cardiovascular Studies A twelve-lead ECG was obtained on 08/25/2018 and reveals normal sinus rhythm with a heart rate of 81 bpm. ?Multiple premature supraventricular and ventricular ectopic beats are noted. ?There is no evidence of myocardial ischemia or infarction.  ?  Abs from 12/08/2018 revealed serum potassium 4.5 mmol/L and serum creatinine 0.86 mg/dL.    Echo Doppler 09/09/2018:  1. No regional wall motion abnormalities are seen. Overall LV systolic function appears normal and dynamic. The estimated left ventricular ejection fraction is 65%.   2. Normal left ventricular diastolic function.   3. Right ventricular contractility appears normal.  4. Normal atrial chamber dimensions.  5. 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 There is no evidence of significant valvular regurgitation or stenosis by doppler exam.  6. No pericardial effusion is seen.  ?  Labs from 02/09/2018 revealed total cholesterol 236, triglycerides 125, HDL 47 LDL cholesterol 172 mg/dL.    Problems Addressed Today  Encounter Diagnoses   Name Primary?   ? ALT (SGPT) level raised Yes   ? Poorly-controlled hypertension    ? Dyspnea, unspecified type    ? Hyperlipidemia, unspecified hyperlipidemia type        Assessment and Plan     Ariel Lee's blood pressure appears very well controlled with amlodipine 2.5 mg daily.  Her estimated risk for developing ASCVD over a 10-year period time is 15.7%.  Alternatives for the treatment of hypercholesterolemia were reviewed with the patient and she wanted to try a small dose of rosuvastatin 5 mg daily.  Possible adverse effects associated with this medication were reviewed with the patient and she was asked to report any myalgias or other worrisome symptoms.  I have asked her to check her ALT prior to starting rosuvastatin.  I have asked [name] to return for follow-up in approximately 4 months time to follow her progress.         Current Medications (including today's revisions) ? amLODIPine (NORVASC) 2.5 mg tablet Take one tablet by mouth daily.   ? escitalopram oxalate (LEXAPRO) 10 mg tablet Take 10 mg by mouth at bedtime daily.   ? meloxicam (MOBIC) 15 mg tablet Take 15 mg by mouth at bedtime daily.   ? omeprazole DR(+) (PRILOSEC) 20 mg capsule Take 20 mg by mouth at bedtime daily.   ? ondansetron (ZOFRAN ODT) 4 mg rapid dissolve tablet Dissolve one tablet to two tablets by mouth every 8 hours as needed for Nausea or Vomiting. Place on tongue to dissolve.   ? rosuvastatin (CRESTOR) 5 mg tablet Take one tablet by mouth daily.   ? vit C/E/Zn/coppr/lutein/zeaxan (PRESERVISION AREDS-2 PO) Take  by mouth.

## 2019-03-10 ENCOUNTER — Encounter

## 2019-03-10 DIAGNOSIS — E785 Hyperlipidemia, unspecified: Secondary | ICD-10-CM

## 2019-03-10 DIAGNOSIS — I10 Essential (primary) hypertension: Secondary | ICD-10-CM

## 2019-03-10 DIAGNOSIS — R0600 Dyspnea, unspecified: Secondary | ICD-10-CM

## 2019-03-10 NOTE — Telephone Encounter
-----   Message from Hester Mates, MD sent at 03/10/2019  8:48 AM CST -----  Brett Canales: Molli Knock to start rosuvastatin.  Please let her know.  Thanks.  SBG  ----- Message -----  From: Rogelia Boga, RN  Sent: 03/10/2019   8:38 AM CST  To: Hester Mates, MD    Labs for your review.  Please let me know your recommendations.  Thank you!

## 2019-03-10 NOTE — Telephone Encounter
Results and recommendations called to patient lmom requested call back if questions

## 2019-05-30 ENCOUNTER — Encounter: Admit: 2019-05-30 | Discharge: 2019-05-30 | Payer: MEDICARE

## 2019-05-31 ENCOUNTER — Encounter: Admit: 2019-05-31 | Discharge: 2019-05-31 | Payer: MEDICARE

## 2019-05-31 DIAGNOSIS — E785 Hyperlipidemia, unspecified: Secondary | ICD-10-CM

## 2019-05-31 MED ORDER — ATORVASTATIN 20 MG PO TAB
20 mg | ORAL_TABLET | Freq: Every day | ORAL | 3 refills | Status: DC
Start: 2019-05-31 — End: 2019-06-29

## 2019-05-31 NOTE — Telephone Encounter
Patient is requesting to change from rosuvastatin to an alternative due to cost of medication. Rosuvastatin = $225 vs. Atorvastatin $0.     Will route to Dr. Arna Medici for his recommendtations.

## 2019-05-31 NOTE — Telephone Encounter
Ariel Mates, MD  Lauralee Evener, RN  Caller: Unspecified (Today, 7:58 AM)  Shawna OrleansJoen Laura try atorvastatin 20 mg daily. Recheck fasting lipid profile and ALT in 2 months. Thanks. SBG

## 2019-06-25 ENCOUNTER — Encounter: Admit: 2019-06-25 | Discharge: 2019-06-25 | Payer: MEDICARE

## 2019-06-29 ENCOUNTER — Encounter: Admit: 2019-06-29 | Discharge: 2019-06-29 | Payer: MEDICARE

## 2019-06-29 DIAGNOSIS — F419 Anxiety disorder, unspecified: Secondary | ICD-10-CM

## 2019-06-29 DIAGNOSIS — F329 Major depressive disorder, single episode, unspecified: Secondary | ICD-10-CM

## 2019-06-29 DIAGNOSIS — R0600 Dyspnea, unspecified: Secondary | ICD-10-CM

## 2019-06-29 DIAGNOSIS — M1990 Unspecified osteoarthritis, unspecified site: Secondary | ICD-10-CM

## 2019-06-29 DIAGNOSIS — Z720 Tobacco use: Secondary | ICD-10-CM

## 2019-06-29 NOTE — Progress Notes
Date of Service: 06/29/2019    Ariel Lee is a 69 y.o. female.       HPI    Ariel Lee has been followed for hypertension.?  When I saw her in October 2020 she was hypertensive and amlodipine 2.5 mg daily was started.  This has controlled her blood pressure very well without any major adverse effects.  Initially she felt somewhat lethargic when she first started taking amlodipine, although this has improved in recent months. Her blood pressure has been running less than 130/80 mmHg most of the time.  She has also started rosuvastatin 5 mg daily which has dramatically lowered her LDL cholesterol.  Her greatest problem is arthritis in a vast number of joints which limits her mobility and ability to exercise. 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 ?Otherwise from a cardiovascular perspective, the patient has been?stable?and reports no angina, congestive symptoms, palpitations, sensation of sustained forceful heart pounding, lightheadedness or syncope. Her exercise tolerance has been stable, although she does not have a regular exercise program. The patient reports no myalgias, bleeding abnormalities, neurologic motor abnormalities or difficulty with speech.? Ariel Lee has been tolerating rosuvastatin without difficulty.  She has a long history of cigarette smoking and is still smoking 1 pack/day.   Historically, Ariel Lee several orthopedic procedures in 2020. ?In January 2020 she underwent left knee arthroplasty and in February 2020 she underwent right knee arthroplasty. ?On Jun 24, 2018 she underwent right 2nd metatarsal head excision., right 3rd metatarsal head excision?and right 2nd proximal interphalangeal joint fusion?for?correction of??right foot dislocated 2nd and 3rd metatarsophalangeal joints, metatarsophalangeal arthrosis, and?2nd hammertoe deformity.?           Vitals:    06/29/19 1522 06/29/19 1532   BP: 126/78 124/80   BP Source: Arm, Left Upper Arm, Right Upper   Patient Position: Sitting Sitting   Pulse: 76    SpO2: 96%    Weight: 98.3 kg (216 lb 12.8 oz)    Height: 1.676 m (5' 6)    PainSc: Zero      Body mass index is 34.99 kg/m?[name]     Past Medical History  Patient Active Problem List    Diagnosis Date Noted   ? Poorly-controlled hypertension 05/16/2015   ? Dyspnea 05/16/2015   ? Jaw pain 05/16/2015   ? Tobacco abuse 05/16/2015   ? 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   HENT: Negative.    Eyes: Negative.    Cardiovascular: Negative.    Respiratory: Negative.    Endocrine: Negative.    Hematologic/Lymphatic: Negative.    Skin: Negative.    Musculoskeletal: Positive for arthritis and joint pain.   Gastrointestinal: Negative.    Genitourinary: Negative.    Neurological: Negative.    Psychiatric/Behavioral: The patient has insomnia.    Allergic/Immunologic: Negative.        Physical Exam  GENERAL: The patient is well developed, well nourished, resting comfortably and in no distress.   HEENT: No abnormalities of the visible oro-nasopharynx, conjunctiva or sclera are noted.  NECK: There is no jugular venous distension. Carotids are palpable and without bruits. There is no thyroid enlargement.  Chest: Lung fields are clear to auscultation. There are no wheezes or crackles.  CV: There is a regular rhythm. The first and second heart sounds are normal.??A  grade 3/6 systolic ejection murmur is heard loudest in the right upper sternal border. ?There are no?diastolic?murmurs, gallops or rubs.??Her apical heart rate is 76 bpm.  ABD: The abdomen is soft and supple with normal bowel sounds. There is no hepatosplenomegaly, ascites, tenderness, masses or bruits.  Neuro: There are no focal motor defects. Ambulation is normal. Cognitive function appears normal.  Ext:?There is no edema or evidence of deep vein thrombosis. Peripheral pulses are satisfactory. ?Improving incision near the right second metatarsal.  SKIN:?There are no rashes and no cellulitis  PSYCH:?The patient is calm, rationale and oriented.    Cardiovascular Studies  A twelve-lead ECG was obtained on 08/25/2018 and reveals normal sinus rhythm with a heart rate of 81 bpm. ?Multiple premature supraventricular and ventricular ectopic beats are noted. ?There is no evidence of myocardial ischemia or infarction.  ?  Labs from 12/08/2018 revealed serum potassium 4.5 mmol/L and serum creatinine 0.86 mg/dL.  ?  Echo Doppler 09/09/2018:  1. No regional wall motion abnormalities are seen. Overall LV systolic function appears normal and dynamic. The estimated left ventricular ejection fraction is 65%.   2. Normal left ventricular diastolic function.   3. Right ventricular contractility appears normal.  4. Normal atrial chamber dimensions.  5. 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 There is no evidence of significant valvular regurgitation or stenosis by doppler exam.  6. No pericardial effusion is seen.  ?  Labs from 02/09/2018 revealed total cholesterol 236, triglycerides 125, HDL 47 LDL cholesterol 172 mg/dL.  Labs from 06/04/2019 on rosuvastatin 5 mg daily revealed hemoglobin 14.6 g%.  Her potassium was 4.7 mmol/L and serum creatinine 0.85 mg/dL.  ALT =15 units/L.  Her total cholesterol is 153, triglycerides 93, HDL 47 and LDL cholesterol 87 mg/dL.  Her TSH = 3.15.    Problems Addressed Today  Encounter Diagnoses   Name Primary?   ? Tobacco abuse Yes   ? Dyspnea, unspecified type        Assessment and Plan     [name]'s blood pressure appears very well controlled with amlodipine 2.5 mg daily.  Her LDL cholesterol has improved dramatically with 5 mg of rosuvastatin once daily. Smoking: The patient was informed of the multiple risk hazards posed by cigarette smoking which may be life-threatening and was instructed not to smoke. Regular mild aerobic exercise, weight loss and adherence to a heart healthy diet were recommended.  Swimming exercises were recommended to the patient. I have asked Ms. Smiddy to return for follow-up in approximately 6 months time to follow her progress.         Current Medications (including today's revisions)  ? amLODIPine (NORVASC) 2.5 mg tablet Take one tablet by mouth daily.   ? aspirin EC 81 mg tablet Take 81 mg by mouth daily. Take with food.   ? cholecalciferol (vitamin D3) (OPTIMAL D3) 50,000 units capsule Take 50,000 Units by mouth every 7 days.   ? escitalopram oxalate (LEXAPRO) 10 mg tablet Take 10 mg by mouth at bedtime daily.   ? LORazepam (ATIVAN) 0.5 mg tablet Take 0.5 mg by mouth at bedtime as needed for Nausea.   ? meloxicam (MOBIC) 15 mg tablet Take 15 mg by mouth at bedtime daily.   ? omeprazole DR (PRILOSEC) 40 mg capsule Take 40 mg by mouth daily before breakfast.   ? rosuvastatin (CRESTOR) 5 mg tablet Take 5 mg by mouth daily.   ? vit C/E/Zn/coppr/lutein/zeaxan (PRESERVISION AREDS-2 PO) Take 2 tablets by mouth daily.

## 2019-11-27 ENCOUNTER — Encounter: Admit: 2019-11-27 | Discharge: 2019-11-27 | Payer: MEDICARE

## 2019-11-27 DIAGNOSIS — R0600 Dyspnea, unspecified: Secondary | ICD-10-CM

## 2019-11-27 DIAGNOSIS — I10 Essential (primary) hypertension: Secondary | ICD-10-CM

## 2019-11-27 MED ORDER — AMLODIPINE 2.5 MG PO TAB
ORAL_TABLET | Freq: Every day | 3 refills
Start: 2019-11-27 — End: ?

## 2019-12-28 ENCOUNTER — Encounter: Admit: 2019-12-28 | Discharge: 2019-12-28 | Payer: MEDICARE

## 2019-12-28 DIAGNOSIS — Z720 Tobacco use: Secondary | ICD-10-CM

## 2019-12-28 DIAGNOSIS — R0600 Dyspnea, unspecified: Secondary | ICD-10-CM

## 2019-12-28 DIAGNOSIS — F419 Anxiety disorder, unspecified: Secondary | ICD-10-CM

## 2019-12-28 DIAGNOSIS — M1990 Unspecified osteoarthritis, unspecified site: Secondary | ICD-10-CM

## 2019-12-28 DIAGNOSIS — F32A Depression: Secondary | ICD-10-CM

## 2019-12-28 NOTE — Progress Notes
Date of Service: 12/28/2019    Ariel Lee is a 69 y.o. female.       HPI     Ariel Lee has been followed for hypertension and hypercholesterolemia.  She had a decayed tooth and required a root canal.  This was associated with left-sided facial tingling and precipitated a neurologic evaluation.  However the facial tingling resolved with her dental procedure.  Her LDL cholesterol dramatically lowered with the addition of a small dose of rosuvastatin and her blood pressure has been very well controlled in recent months. 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 ?Otherwise from a cardiovascular perspective, the patient has been?stable?and reports no angina, congestive symptoms, palpitations, sensation of sustained forceful heart pounding, lightheadedness or syncope. Her exercise tolerance has been stable, although she does not have a regular exercise program. The patient reports no myalgias, bleeding abnormalities, neurologic motor abnormalities or difficulty with speech.? Ariel Lee has been tolerating rosuvastatin without difficulty.  She has a long history of cigarette smoking and is still smoking 1 pack/day.   Historically, Ariel Lee several orthopedic procedures in 2020. ?In January 2020 she underwent left knee arthroplasty and in February 2020 she underwent right knee arthroplasty. ?On Jun 24, 2018 she underwent right 2nd metatarsal head excision., right 3rd metatarsal head excision?and right 2nd proximal interphalangeal joint fusion?for?correction of??right foot dislocated 2nd and 3rd metatarsophalangeal joints, metatarsophalangeal arthrosis, and?2nd hammertoe deformity.?  ?       Vitals:    12/28/19 1325 12/28/19 1337   BP: 124/86 120/84   BP Source: Arm, Left Upper Arm, Right Upper Patient Position: Sitting Sitting   Pulse: 82    SpO2: 98%    Weight: 97.9 kg (215 lb 12.8 oz)    Height: 1.676 m (5' 6)    PainSc: Zero      Body mass index is 34.83 kg/m?[name]     Past Medical History  Patient Active Problem List    Diagnosis Date Noted   ? Poorly-controlled hypertension 05/16/2015   ? Dyspnea 05/16/2015   ? Jaw pain 05/16/2015   ? Tobacco abuse 05/16/2015   ? 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   HENT: Negative.    Eyes: Negative.    Cardiovascular: Positive for dyspnea on exertion.   Respiratory: Negative.    Endocrine: Negative.    Hematologic/Lymphatic: Negative.    Skin: Negative.    Musculoskeletal: Negative.    Gastrointestinal: Negative.    Genitourinary: Negative.    Neurological: Negative.    Psychiatric/Behavioral: The patient has insomnia.    Allergic/Immunologic: Negative.        Physical Exam  GENERAL: The patient is well developed, well nourished, resting comfortably and in no distress.   HEENT: No abnormalities of the visible oro-nasopharynx, conjunctiva or sclera are noted.  NECK: There is no jugular venous distension. Carotids are palpable and without bruits. There is no thyroid enlargement.  Chest: Lung fields are clear to auscultation. There are no wheezes or crackles.  CV: There is a regular rhythm. The first and second heart sounds are normal.??A grade 3/6 systolic ejection murmur is heard loudest in the right upper sternal border. ?There are no?diastolic?murmurs, gallops or rubs.??Her apical heart rate is  80 bpm.  ABD: The abdomen is soft and supple with normal bowel sounds. There is no hepatosplenomegaly, ascites, tenderness, masses or bruits.  Neuro: There are no focal motor defects. Ambulation is normal. Cognitive function appears normal.  Ext:?There is no edema or evidence of deep vein thrombosis. Peripheral pulses are satisfactory. ?Improving incision near the right second metatarsal.  SKIN:?There are no rashes and no cellulitis  PSYCH:?The patient is calm, rationale and oriented.    Cardiovascular Studies  A twelve-lead ECG was obtained on 08/25/2018 and reveals normal sinus rhythm with a heart rate of 81 bpm. ?Multiple premature supraventricular and ventricular ectopic beats are noted. ?There is no evidence of myocardial ischemia or infarction.  A twelve-lead ECG obtained on 12/28/2019 reveals normal sinus rhythm with a heart rate of 62 bpm.  Left axis deviation is noted.  ?  Echo Doppler 09/09/2018:  1. No regional wall motion abnormalities are seen. Overall LV systolic function appears normal and dynamic. The estimated left ventricular ejection fraction is 65%.   2. Normal left ventricular diastolic function.   3. Right ventricular contractility appears normal.  4. Normal atrial chamber dimensions.  5. 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 There is no evidence of significant valvular regurgitation or stenosis by doppler exam.  6. No pericardial effusion is seen.  ?  Labs from 02/09/2018 revealed total cholesterol 236, triglycerides 125, HDL 47 LDL cholesterol 172 mg/dL. Labs from 12/08/2018 revealed serum potassium 4.5 mmol/L and serum creatinine 0.86 mg/dL.  Labs from 06/04/2019 on rosuvastatin 5 mg daily revealed hemoglobin 14.6 g%.  Her potassium was 4.7 mmol/L and serum creatinine 0.85 mg/dL.  ALT =15 units/L.  Her total cholesterol is 153, triglycerides 93, HDL 47 and LDL cholesterol 87 mg/dL.  Her TSH = 3.15.    Cardiovascular Health Factors  Vitals BP Readings from Last 3 Encounters:   12/28/19 120/84   06/29/19 124/80   03/09/19 118/76     Wt Readings from Last 3 Encounters:   12/28/19 97.9 kg (215 lb 12.8 oz)   06/29/19 98.3 kg (216 lb 12.8 oz)   03/09/19 97.1 kg (214 lb)     BMI Readings from Last 3 Encounters:   12/28/19 34.83 kg/m?   06/29/19 34.99 kg/m?   03/09/19 34.54 kg/m?      Smoking Social History     Tobacco Use   Smoking Status Current Every Day Smoker   ? Packs/day: 1.00   ? Types: Cigarettes   Smokeless Tobacco Never Used      Lipid Profile Cholesterol   Date Value Ref Range Status   06/04/2019 153  Final     HDL   Date Value Ref Range Status   06/04/2019 47  Final     LDL   Date Value Ref Range Status   06/04/2019 87  Final     Triglycerides   Date Value Ref Range Status   06/04/2019 93  Final      Blood Sugar No results found for: HGBA1C  Glucose   Date Value Ref Range Status   06/04/2019 98  Final   12/08/2018 103  Final   08/09/2014 98  Final          Problems Addressed Today  Encounter Diagnoses   Name Primary?   ? Tobacco abuse    ? Dyspnea, unspecified type        Assessment and Plan     Ms. Howland's blood pressure appears very well controlled with amlodipine 2.5 mg daily.  Her LDL cholesterol has improved dramatically with 5 mg of rosuvastatin once daily.  I recommended increasing her dose to 10 mg daily to see if we could reduce her LDL cholesterol to below 70 mg/dL but she was content with her current dose. ?Smoking: The patient was informed of the multiple risk hazards posed by cigarette smoking which may be life-threatening and was instructed not to smoke. Regular mild aerobic exercise, weight loss and adherence to a heart healthy diet were recommended.  Swimming exercises were recommended to the patient.?I have asked Ms. Valladares to return for follow-up in approximately 12 months time to follow her progress.           Current Medications (including today's revisions)  ? amLODIPine (NORVASC) 2.5 mg tablet TAKE 1 TABLET BY MOUTH EVERY DAY   ? aspirin EC 81 mg tablet Take 81 mg by mouth daily. Take with food.   ? cholecalciferol (VITAMIN D-3) 50 mcg (2,000 unit) tablet Take 2,000 Units by mouth daily.   ? escitalopram oxalate (LEXAPRO) 10 mg tablet Take 10 mg by mouth at bedtime daily.   ? LORazepam (ATIVAN) 0.5 mg tablet Take 0.5 mg by mouth at bedtime as needed for Nausea.   ? meloxicam (MOBIC) 15 mg tablet Take 15 mg by mouth at bedtime daily.   ? omeprazole DR (PRILOSEC) 40 mg capsule Take 40 mg by mouth daily before breakfast.   ? rosuvastatin (CRESTOR) 5 mg tablet Take 5 mg by mouth daily.   ? vit C/E/Zn/coppr/lutein/zeaxan (PRESERVISION AREDS-2 PO) Take 2 tablets by mouth daily.

## 2019-12-29 ENCOUNTER — Encounter: Admit: 2019-12-29 | Discharge: 2019-12-29 | Payer: MEDICARE

## 2020-10-02 ENCOUNTER — Encounter: Admit: 2020-10-02 | Discharge: 2020-10-02 | Payer: MEDICARE

## 2020-10-02 DIAGNOSIS — Z720 Tobacco use: Secondary | ICD-10-CM

## 2020-10-02 DIAGNOSIS — R0600 Dyspnea, unspecified: Secondary | ICD-10-CM

## 2020-10-02 DIAGNOSIS — I10 Essential (primary) hypertension: Secondary | ICD-10-CM

## 2020-10-02 DIAGNOSIS — Z01810 Encounter for preprocedural cardiovascular examination: Secondary | ICD-10-CM

## 2020-10-02 NOTE — Telephone Encounter
Hester Mates, MD  Florene Route, RN; Weston Brass  Caller: Unspecified (Today, 11:40 AM)  I would recommend that she obtain an echo Doppler study, regadenoson thallium stress test and clinic visit in order to provide a preoperative assessment. Thanks. SBG

## 2020-10-02 NOTE — Telephone Encounter
Received fax from [facility] stating that patient is needing a right reverse total shoulder arthroplasty and they are wanting cardiac risk stratification from Dr. Arna Medici along with guidance on holding her aspirin prior to procedure.    Patient last saw Dr. Arna Medici 12/28/19  Most recent testing includes: Echo 09/09/18- No regional wall motion abnormalities are seen. Overall LV systolic function appears normal and dynamic. The estimated left ventricular ejection fraction is 65%. Normal left ventricular diastolic function. Right ventricular contractility appears normal.Normal atrial chamber dimensions.Mild aortic valve sclerosis without stenosis. There is no evidence of significant valvular regurgitation or stenosis by doppler exam. No pericardial effusion is seen.    Called and spoke with patient regarding cardiac symptoms. Patient states that she is overall doing great. She denies any cardiac symptoms of chest pain, palpitations, shortness of breath, or decreased activity tolerance.     Patient is taking 81mg  of Aspirin daily due to her history of smoking.       Will route to Dr. for recommendations.

## 2020-10-09 ENCOUNTER — Encounter: Admit: 2020-10-09 | Discharge: 2020-10-09 | Payer: MEDICARE

## 2020-10-09 NOTE — Telephone Encounter
Discussed stress test instructions for her appointment on Wednesday including date, time, and location with patient. Instructions included the following:     NO DECAF OR CAFFEINE 24 HOURS PRIOR TO TEST. Examples: coffee, tea, decaf coffee or tea, cola, chocolate.      DO NOT EAT OR DRINK THE MORNING OF YOUR TEST unless otherwise instructed. (You may have a couple sips of water.) If you are a diabetic, if insulin dependent: please take one third of your insulin with a light breakfast (two pieces of dry toast and a small juice). Bring insulin and medication with you to the test.      TAKE MORNING MEDICATIONS WITH A COUPLE SIPS OF WATER PRIOR TO TEST.      Hold all vitamins and supplements on the morning of your test.   Reminder-this is a two part test, with a couple hour break in between imaging.     Phone number provided for any additional questions or concerns.     Pt verbalized understanding. No questions at this time.

## 2020-10-11 ENCOUNTER — Encounter: Admit: 2020-10-11 | Discharge: 2020-10-11 | Payer: MEDICARE

## 2020-10-11 ENCOUNTER — Ambulatory Visit: Admit: 2020-10-11 | Discharge: 2020-10-11 | Payer: MEDICARE

## 2020-10-11 DIAGNOSIS — Z720 Tobacco use: Secondary | ICD-10-CM

## 2020-10-11 DIAGNOSIS — I10 Essential (primary) hypertension: Secondary | ICD-10-CM

## 2020-10-11 DIAGNOSIS — Z01810 Encounter for preprocedural cardiovascular examination: Secondary | ICD-10-CM

## 2020-10-11 DIAGNOSIS — R0600 Dyspnea, unspecified: Secondary | ICD-10-CM

## 2020-10-11 MED ORDER — NITROGLYCERIN 0.4 MG SL SUBL
.4 mg | SUBLINGUAL | 0 refills | Status: AC | PRN
Start: 2020-10-11 — End: ?

## 2020-10-11 MED ORDER — RP DX TC-99M TETROFOSMIN MCI
26 | Freq: Once | INTRAVENOUS | 0 refills | Status: CP
Start: 2020-10-11 — End: ?

## 2020-10-11 MED ORDER — RP DX TC-99M TETROFOSMIN MCI
9 | Freq: Once | INTRAVENOUS | 0 refills | Status: CP
Start: 2020-10-11 — End: ?

## 2020-10-11 MED ORDER — ALBUTEROL SULFATE 90 MCG/ACTUATION IN HFAA
2 | RESPIRATORY_TRACT | 0 refills | Status: AC | PRN
Start: 2020-10-11 — End: ?

## 2020-10-11 MED ORDER — SODIUM CHLORIDE 0.9 % IV SOLP
250 mL | INTRAVENOUS | 0 refills | Status: AC | PRN
Start: 2020-10-11 — End: ?

## 2020-10-11 MED ORDER — REGADENOSON 0.4 MG/5 ML IV SYRG
.4 mg | Freq: Once | INTRAVENOUS | 0 refills | Status: CP
Start: 2020-10-11 — End: ?

## 2020-10-11 MED ORDER — EUCALYPTUS-MENTHOL MM LOZG
1 | Freq: Once | ORAL | 0 refills | Status: AC | PRN
Start: 2020-10-11 — End: ?

## 2020-10-11 MED ORDER — AMINOPHYLLINE 500 MG/20 ML IV SOLN
50 mg | INTRAVENOUS | 0 refills | Status: AC | PRN
Start: 2020-10-11 — End: ?

## 2020-10-13 ENCOUNTER — Encounter: Admit: 2020-10-13 | Discharge: 2020-10-13 | Payer: MEDICARE

## 2020-10-13 NOTE — Telephone Encounter
Results and recommendations called to patient. Patient has no questions at this time.

## 2020-10-13 NOTE — Telephone Encounter
-----   Message from Hester Mates, MD sent at 10/13/2020 12:19 PM CDT -----  To all: Her stress test looks favorable.  Please let her know.  It appears that she has an echo Doppler study scheduled for September and then a clinic visit with me in October 2022 to conclude her preoperative evaluation.  Thanks.  SBG  ----- Message -----  From: Levora Angel, MD  Sent: 10/12/2020   4:08 PM CDT  To: Hester Mates, MD

## 2020-10-17 ENCOUNTER — Encounter: Admit: 2020-10-17 | Discharge: 2020-10-17 | Payer: MEDICARE

## 2020-10-17 NOTE — Telephone Encounter
Results and recommendations called to patient lmom requested call back if questions

## 2020-10-17 NOTE — Telephone Encounter
-----   Message from Steven B Gollub, MD sent at 10/13/2020 12:19 PM CDT -----  To all: Her stress test looks favorable.  Please let her know.  It appears that she has an echo Doppler study scheduled for September and then a clinic visit with me in October 2022 to conclude her preoperative evaluation.  Thanks.  SBG  ----- Message -----  From: Hoos-Thompson, Shannon N, MD  Sent: 10/12/2020   4:08 PM CDT  To: Steven B Gollub, MD

## 2020-11-01 ENCOUNTER — Encounter: Admit: 2020-11-01 | Discharge: 2020-11-01 | Payer: MEDICARE

## 2020-11-01 ENCOUNTER — Ambulatory Visit: Admit: 2020-11-01 | Discharge: 2020-11-01 | Payer: MEDICARE

## 2020-11-01 DIAGNOSIS — I10 Essential (primary) hypertension: Secondary | ICD-10-CM

## 2020-11-01 DIAGNOSIS — Z01810 Encounter for preprocedural cardiovascular examination: Secondary | ICD-10-CM

## 2020-11-01 DIAGNOSIS — Z720 Tobacco use: Secondary | ICD-10-CM

## 2020-11-01 DIAGNOSIS — R0600 Dyspnea, unspecified: Secondary | ICD-10-CM

## 2020-11-01 NOTE — Telephone Encounter
-----   Message from Hester Mates, MD sent at 11/01/2020  3:19 PM CDT -----  To all: Her echo Doppler study looks favorable.  Please let her know.  It appears that she is scheduled to see me in December 07, 2020 to conclude her preoperative evaluation.  Thanks.  SBG  ----- Message -----  From: Hester Mates, MD  Sent: 11/01/2020   3:16 PM CDT  To: Hester Mates, MD

## 2020-11-01 NOTE — Telephone Encounter
Results and recommendations called to patient. Patient has no questions at this time.

## 2020-11-14 ENCOUNTER — Encounter: Admit: 2020-11-14 | Discharge: 2020-11-14 | Payer: MEDICARE

## 2020-11-14 DIAGNOSIS — F419 Anxiety disorder, unspecified: Secondary | ICD-10-CM

## 2020-11-14 DIAGNOSIS — Z720 Tobacco use: Secondary | ICD-10-CM

## 2020-11-14 DIAGNOSIS — R0600 Dyspnea, unspecified: Secondary | ICD-10-CM

## 2020-11-14 DIAGNOSIS — F32A Depression: Secondary | ICD-10-CM

## 2020-11-14 DIAGNOSIS — E785 Hyperlipidemia, unspecified: Secondary | ICD-10-CM

## 2020-11-14 DIAGNOSIS — Z136 Encounter for screening for cardiovascular disorders: Secondary | ICD-10-CM

## 2020-11-14 DIAGNOSIS — M1990 Unspecified osteoarthritis, unspecified site: Secondary | ICD-10-CM

## 2020-11-14 MED ORDER — EZETIMIBE 10 MG PO TAB
10 mg | ORAL_TABLET | Freq: Every day | ORAL | 3 refills | Status: AC
Start: 2020-11-14 — End: ?

## 2020-11-14 NOTE — Progress Notes
Date of Service: 11/14/2020    Ariel Lee is a 70 y.o. female.       HPI     Ms.?Ariel Lee has been followed for hypertension and hypercholesterolemia.  She has been scheduled for reverse right shoulder replacement on December 08, 2020.  She is having considerable ongoing pain in her right shoulder. Her LDL cholesterol dramatically lowered with the addition of a small dose of rosuvastatin and her blood pressure has been very well controlled in recent months.  She only tolerates 5 mg of rosuvastatin daily develops myalgias when her dose is increased to 10 mg daily.  Ms. 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 ?Otherwise from a cardiovascular perspective, the patient has been?stable?and reports no angina, congestive symptoms, palpitations, sensation of sustained forceful heart pounding, lightheadedness or syncope. Her exercise tolerance has been stable, although she does not have a regular exercise program. The patient reports no myalgias, bleeding abnormalities, neurologic motor abnormalities or difficulty with speech.?She has a long history of cigarette smoking and is still smoking 1 pack/day.  ?Historically, Ms. Ariel Lee several orthopedic procedures in 2020. ?In January 2020 she underwent left knee arthroplasty and in February 2020 she underwent right knee arthroplasty. ?On Jun 24, 2018 she underwent right 2nd metatarsal head excision., right 3rd metatarsal head excision?and right 2nd proximal interphalangeal joint fusion?for?correction of??right foot dislocated 2nd and 3rd metatarsophalangeal joints, metatarsophalangeal arthrosis, and?2nd hammertoe deformity.?       Vitals:    11/14/20 1027   BP: 128/82   BP Source: Arm, Left Upper   Pulse: 78   SpO2: 99%   O2 Device: None (Room air)   PainSc: Zero   Weight: 95.3 kg (210 lb)   Height: 167.6 cm (5' 6)     Body mass index is 33.89 kg/m?Ariel Lee     Past Medical History  Patient Active Problem List    Diagnosis Date Noted   ? Gastroesophageal reflux disease 10/11/2020   ? Obesity 10/11/2020   ? Anxiety 05/08/2017   ? Poorly-controlled hypertension 05/16/2015   ? Dyspnea 05/16/2015   ? Jaw pain 05/16/2015   ? Tobacco abuse 05/16/2015   ? 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   HENT: Negative.    Eyes: Negative.    Cardiovascular: Negative.    Respiratory: Negative.    Endocrine: Negative.    Hematologic/Lymphatic: Negative.    Skin: Negative.    Musculoskeletal: Negative.    Gastrointestinal: Negative.    Genitourinary: Negative.    Neurological: Negative.    Psychiatric/Behavioral: Negative.    Allergic/Immunologic: Negative.        Physical Exam  GENERAL: The patient is well developed, well nourished, resting comfortably and in no distress.   HEENT: No abnormalities of the visible oro-nasopharynx, conjunctiva or sclera are noted.  NECK: There is no jugular venous distension. Carotids are palpable and without bruits. There is no thyroid enlargement.  Chest: Lung fields are clear to auscultation. There are no wheezes or crackles.  CV: There is a regular rhythm. The first and second heart sounds are normal.??A grade 3/6 systolic ejection murmur is heard loudest in the right upper sternal border. ?There are no?diastolic?murmurs, gallops or rubs.??Her apical heart rate is?68?bpm.  ABD: The abdomen is soft and supple with normal bowel sounds. There is no hepatosplenomegaly, ascites, tenderness,  masses or bruits.  Neuro: There are no focal motor defects. Ambulation is normal. Cognitive function appears normal.  Ext:?There is no edema or evidence of deep vein thrombosis. Peripheral pulses are satisfactory. ?Improving incision near the right second metatarsal.  SKIN:?There are no rashes and no cellulitis  PSYCH:?The patient is calm, rationale and oriented.    Cardiovascular Studies  A twelve-lead ECG obtained on 11/14/2020 reveals normal sinus rhythm with a heart rate of 67 bpm.  A premature atrial beat and premature ventricular beats are noted.    Echo Doppler 11/01/2020:  Interpretation Summary    1. No regional wall motion abnormalities are seen. Overall left ventricular systolic function appears normal and dynamic. The estimated left ventricular ejection fraction is 65%. ?Left ventricular contractility appears similar when compared with the prior echocardiogram performed on 09/09/18.   2. Normal left ventricular diastolic function.  3. Right ventricular chamber dimensions and contractility appear normal.  4. Normal atrial chamber dimensions.  5. Mild mitral annular calcification is noted.  Otherwise, cardiac valve structures are unremarkable. There is no evidence of significant valvular regurgitation or stenosis by doppler exam.  6. No pericardial effusion is seen.    Regadenoson thallium stress test 10/11/2020:  Scintigraphic (planar/tomographic): Tomographic images were reconstructed in three orthogonal views.  There is normal homogenous uptake of thallium in all myocardial segments.  There are no perfusion defects.  Polar coordinate map identifies no statistically significant perfusion abnormalities.TID Ratio:  0.8  (normal <1.36). Summed Stress Score:  0   , Summed Rest Score:  0. Regional Wall Thickening and Motion Post Stress:  ?Left ventricular function is hyperdynamic with no regional wall motion abnormalities.  There is normal thickening of all myocardial segments.Left Ventricular Ejection Fraction (post stress, in the resting state) =? 83 %. Left Ventricular End Diastolic Volume: 48 mL  SUMMARY/OPINION:??This study is normal with no evidence of significant myocardial ischemia.  Left ventricular function is hyperdynamic with no regional wall motion abnormalities.  There are no high risk prognostic indicators present.  The pharmacologic ECG portion of the study is negative for ischemia.There are no prior studies available for comparison. In aggregate the current study is low risk in regards to predicted annual cardiovascular mortality rate.    Cardiovascular Health Factors  Vitals BP Readings from Last 3 Encounters:   11/14/20 128/82   11/01/20 131/89   12/28/19 120/84     Wt Readings from Last 3 Encounters:   11/14/20 95.3 kg (210 lb)   11/01/20 96 kg (211 lb 8.6 oz)   12/28/19 97.9 kg (215 lb 12.8 oz)     BMI Readings from Last 3 Encounters:   11/14/20 33.89 kg/m?   11/01/20 35.20 kg/m?   12/28/19 34.83 kg/m?      Smoking Social History     Tobacco Use   Smoking Status Current Every Day Smoker   ? Packs/day: 1.00   ? Types: Cigarettes   Smokeless Tobacco Never Used      Lipid Profile Cholesterol   Date Value Ref Range Status   08/15/2020 167  Final     HDL   Date Value Ref Range Status   08/15/2020 46  Final     LDL   Date Value Ref Range Status   08/15/2020 102 (H) <100 Final     Triglycerides   Date Value Ref Range Status   08/15/2020 96  Final      Blood Sugar No results found for: HGBA1C  Glucose   Date Value Ref  Range Status   08/15/2020 98  Final   07/22/2019 101  Final   06/04/2019 98  Final          Problems Addressed Today  Encounter Diagnoses   Name Primary?   ? Screening for heart disease Yes   ? Tobacco abuse    ? Dyspnea, unspecified type    ? Hyperlipidemia, unspecified hyperlipidemia type        Assessment and Plan     Ms. Ariel Lee appears stable from a cardiovascular perspective.  She reports no angina or congestive symptoms and her blood pressure is well controlled.  Alternatives for the treatment of hypercholesterolemia were reviewed with the patient.  She tolerates only a small dose of rosuvastatin because of myalgias.  She wanted to add Zetia to her medical regimen.  Possible adverse effects associated with Zetia were reviewed with the patient and she was asked to report any side effects.  The risk and benefits of continued aspirin were also reviewed with the patient. I told her that aspirin for primary prevention of coronary events is no longer recommended in patients over 73 years of age.  However, she indicated that she was still going to take low-dose aspirin because of the number of coronary risk factors that she had, including the fact that she was still a current cigarette smoker.    Concerning her perioperative risk assessment: Based upon the results of the patient's clinical profile and non-invasive cardiovascular testing, the risk for cardiovascular morbidity and mortality associated with non-cardiac surgery is INTERMEDIATE. This by no means precludes surgery but provides information for the surgeon, anesthesiologist and patient concerning the risks of surgery.  A decision to proceed with surgery should be made based upon the relative benefits and risks involved. Indeed, the orthopedic surgery could be a very helpful intervention for this patient's overall health maintenance and could prove very helpful for improvement in her exercise tolerance, functional capacity and cardiovascular stability. There are no absolute contra-indications to elective surgery, as deemed necessary. No additional cardiovascular testing is required at this time.  The patient indicated that she was willing to accept the risk of holding her aspirin prior to her surgery if required by her surgical team.  I have asked her to return for follow-up in 6 months time and I have asked her to obtain a fasting lipid profile and ALT prior to her next clinic visit. The total time spent during this interview and exam was 30 minutes.         Current Medications (including today's revisions)  ? amLODIPine (NORVASC) 2.5 mg tablet TAKE 1 TABLET BY MOUTH EVERY DAY   ? aspirin EC 81 mg tablet Take 81 mg by mouth daily. Take with food.   ? cholecalciferol (vitamin D3) (VITAMIN D3) 50 mcg (2,000 unit) tablet Take 2,000 Units by mouth daily.   ? escitalopram oxalate (LEXAPRO) 10 mg tablet Take 10 mg by mouth at bedtime daily.   ? ezetimibe (ZETIA) 10 mg tablet Take one tablet by mouth daily.   ? LORazepam (ATIVAN) 0.5 mg tablet Take 0.5 mg by mouth at bedtime as needed for Nausea.   ? meloxicam (MOBIC) 15 mg tablet Take 15 mg by mouth at bedtime daily.   ? omeprazole DR (PRILOSEC) 40 mg capsule Take 40 mg by mouth daily before breakfast.   ? rosuvastatin (CRESTOR) 5 mg tablet Take 5 mg by mouth daily.   ? vit C/E/Zn/coppr/lutein/zeaxan (PRESERVISION AREDS-2 PO) Take 2 tablets by mouth daily.

## 2020-12-14 ENCOUNTER — Encounter: Admit: 2020-12-14 | Discharge: 2020-12-14 | Payer: MEDICARE

## 2020-12-14 DIAGNOSIS — I10 Essential (primary) hypertension: Secondary | ICD-10-CM

## 2020-12-14 DIAGNOSIS — R0600 Dyspnea, unspecified: Secondary | ICD-10-CM

## 2020-12-14 MED ORDER — AMLODIPINE 2.5 MG PO TAB
ORAL_TABLET | Freq: Every day | 3 refills | Status: AC
Start: 2020-12-14 — End: ?

## 2021-05-17 ENCOUNTER — Encounter: Admit: 2021-05-17 | Discharge: 2021-05-17 | Payer: MEDICARE

## 2021-05-18 ENCOUNTER — Encounter: Admit: 2021-05-18 | Discharge: 2021-05-18 | Payer: MEDICARE

## 2021-05-18 DIAGNOSIS — R0600 Dyspnea, unspecified: Secondary | ICD-10-CM

## 2021-05-18 DIAGNOSIS — Z136 Encounter for screening for cardiovascular disorders: Secondary | ICD-10-CM

## 2021-05-18 DIAGNOSIS — Z720 Tobacco use: Secondary | ICD-10-CM

## 2021-05-18 DIAGNOSIS — E785 Hyperlipidemia, unspecified: Secondary | ICD-10-CM

## 2021-05-18 LAB — LIPID PROFILE
CHOLESTEROL/HDL %: 3
CHOLESTEROL: 177
HDL: 52
LDL: 105 — ABNORMAL HIGH (ref <100)
TRIGLYCERIDES: 103
VLDL: 21

## 2021-05-18 LAB — ALT (SGPT): ALT: 17

## 2021-05-18 NOTE — Progress Notes
Contacted patient by phone regarding labs ordered per Dr. Steven Gollub for Lipid Panel and ALT that has not been completed. Patient requested lab orders be faxed to [facility] (FAX: [phone]). Faxed lab orders per patient's request. Notified patient of fasting instructions for lab draw. Patient will have labs drawn before upcoming appointment with SBG on 05/29/2021.

## 2021-05-21 ENCOUNTER — Encounter: Admit: 2021-05-21 | Discharge: 2021-05-21 | Payer: MEDICARE

## 2021-05-21 NOTE — Telephone Encounter
-----   Message from Hester Mates, MD sent at 05/18/2021  4:20 PM CDT -----  Ms. Suski could increase her rosuvastatin to 10 mg daily if she wants to get her LDL cholesterol lower.  Thanks.  SBG  ----- Message -----  From: Rogelia Boga, RN  Sent: 05/18/2021   3:04 PM CDT  To: Hester Mates, MD    Labs for your review.  Erabella is on zetia and rosuvastatin 5mg  daily.  LDL 105.  Please let me or the [place] RNs know if you have recommendations.  She has follow up scheduled with you on 4/11.  Thank you!

## 2021-05-21 NOTE — Telephone Encounter
Pt is not willing to increase rosuvastatin at this time.  She will keep her appointment with SBG as scheduled.

## 2021-05-29 ENCOUNTER — Encounter
Admit: 2021-05-29 | Discharge: 2021-05-29 | Payer: MEDICARE | Primary: Student in an Organized Health Care Education/Training Program

## 2021-05-29 ENCOUNTER — Ambulatory Visit
Admit: 2021-05-29 | Discharge: 2021-05-30 | Payer: MEDICARE | Primary: Student in an Organized Health Care Education/Training Program

## 2021-05-29 DIAGNOSIS — R0600 Dyspnea, unspecified: Secondary | ICD-10-CM

## 2021-05-29 DIAGNOSIS — I10 Essential (primary) hypertension: Secondary | ICD-10-CM

## 2021-05-29 DIAGNOSIS — Z720 Tobacco use: Secondary | ICD-10-CM

## 2021-05-29 DIAGNOSIS — F32A Depression: Secondary | ICD-10-CM

## 2021-05-29 DIAGNOSIS — Z136 Encounter for screening for cardiovascular disorders: Secondary | ICD-10-CM

## 2021-05-29 DIAGNOSIS — M1990 Unspecified osteoarthritis, unspecified site: Secondary | ICD-10-CM

## 2021-05-29 DIAGNOSIS — F419 Anxiety disorder, unspecified: Secondary | ICD-10-CM

## 2021-05-29 MED ORDER — EZETIMIBE 10 MG PO TAB
10 mg | ORAL_TABLET | Freq: Every day | ORAL | 3 refills | Status: AC
Start: 2021-05-29 — End: ?

## 2021-05-29 NOTE — Progress Notes
Date of Service: 05/29/2021    Ariel Lee is a 71 y.o. female.       HPI     Ms.?Lee has been followed for hypertension?and hypercholesterolemia.  She underwent reverse right shoulder replacement on December 08, 2020 with excellent results.  She only tolerates 5 mg of rosuvastatin daily develops myalgias when her dose is increased to 10 mg daily.  Ariel Lee reports that she her vision has been stable and she has not required any intraocular injections for macular degeneration over the past year.  Approximately 4 months ago she developed vertigo and underwent therapy (Epley maneuver) with complete resolution.  She has developed some mild vertigo once again in the interim.  She denies any hearing loss or tinnitus. [name] reports that her blood pressure routinely runs in the neighborhood of 120 over 80 mmHg when she checks it at home.  She has been checking her blood pressure more often because of the vertigo. Otherwise from a cardiovascular perspective, the patient has been?stable?and reports no angina, congestive symptoms, palpitations, sensation of sustained forceful heart pounding, lightheadedness or syncope. Her exercise tolerance has been stable, although she does not have a regular exercise program.  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  The patient reports no myalgias, bleeding abnormalities, neurologic motor abnormalities or difficulty with speech.?She has a long history of cigarette smoking and is still smoking 1 pack/day.  ?Historically, Ariel Lee several orthopedic procedures in 2020. ?In January 2020 she underwent left knee arthroplasty and in February 2020 she underwent right knee arthroplasty. ?On Jun 24, 2018 she underwent right 2nd metatarsal head excision., right 3rd metatarsal head excision?and right 2nd proximal interphalangeal joint fusion?for?correction of??right foot dislocated 2nd and 3rd metatarsophalangeal joints, metatarsophalangeal arthrosis, and?2nd hammertoe deformity.?       Vitals:    05/29/21 0917   BP: (!) 148/94   BP Source: Arm, Left Upper   Pulse: 68   SpO2: 97%   O2 Percent: 97 %   O2 Device: None (Room air)   PainSc: Zero   Weight: 98.2 kg (216 lb 9.6 oz)   Height: 167.6 cm (5' 6)     Body mass index is 34.96 kg/m?Ariel Lee     Past Medical History  Patient Active Problem List    Diagnosis Date Noted   ? Gastroesophageal reflux disease 10/11/2020   ? Obesity 10/11/2020   ? Anxiety 05/08/2017   ? Poorly-controlled hypertension 05/16/2015   ? Dyspnea 05/16/2015   ? Jaw pain 05/16/2015   ? Tobacco abuse 05/16/2015   ? 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   HENT: Negative.    Eyes: Negative.    Cardiovascular: Negative.    Respiratory: Negative.    Endocrine: Negative.    Hematologic/Lymphatic: Negative.    Skin: Negative.    Musculoskeletal: Negative.    Gastrointestinal: Negative.    Genitourinary: Negative.    Neurological: Positive for dizziness.   Psychiatric/Behavioral: Negative.    Allergic/Immunologic: Negative.        Physical Exam  GENERAL: The patient is well developed, well nourished, resting comfortably and in no distress.   HEENT: No abnormalities of the visible oro-nasopharynx, conjunctiva or sclera are noted.  NECK: There is no jugular venous distension. Carotids are palpable and without bruits. There is no thyroid enlargement.  Chest: Lung fields are clear to auscultation. There  are no wheezes or crackles.  CV: There is a regular rhythm. The first and second heart sounds are normal.??A grade 3/6 systolic ejection murmur is heard loudest in the right upper sternal border. ?There are no?diastolic?murmurs, gallops or rubs.??Her apical heart rate is?68?bpm.  ABD: The abdomen is soft and supple with normal bowel sounds. There is no hepatosplenomegaly, ascites, tenderness, masses or bruits.  Neuro: There are no focal motor defects. Ambulation is normal. Cognitive function appears normal.  Ext:?There is no edema or evidence of deep vein thrombosis. Peripheral pulses are satisfactory. ?Improving incision near the right second metatarsal.  SKIN:?There are no rashes and no cellulitis  PSYCH:?The patient is calm, rationale and oriented.    Cardiovascular Studies  A twelve-lead ECG obtained on 11/14/2020 reveals normal sinus rhythm with a heart rate of 67 bpm.  A premature atrial beat and premature ventricular beats are noted.  ?  Echo Doppler 11/01/2020:  Interpretation Summary  ?  1. No regional wall motion abnormalities are seen. Overall left ventricular systolic function appears normal and dynamic. The estimated left ventricular ejection fraction is 65%. ?Left ventricular contractility appears similar when compared with the prior echocardiogram performed on 09/09/18.   2. Normal left ventricular diastolic function.  3. Right ventricular chamber dimensions and contractility appear normal.  4. Normal atrial chamber dimensions.  5. Mild mitral annular calcification is noted. ?Otherwise, cardiac valve structures are unremarkable. There is no evidence of significant valvular regurgitation or stenosis by doppler exam.  6. No pericardial effusion is seen.  ?  Regadenoson thallium stress test 10/11/2020:  Scintigraphic (planar/tomographic):?Tomographic images were reconstructed in three orthogonal views. ?There is normal homogenous uptake of thallium in all myocardial segments. ?There are no perfusion defects. ?Polar coordinate map identifies no statistically significant perfusion abnormalities.TID Ratio: ?0.8 ?(normal <1.36). Summed Stress Score: ?0 ??, Summed Rest Score: ?0. Regional Wall Thickening and Motion Post Stress: ??Left ventricular function is hyperdynamic with no regional wall motion abnormalities. ?There is normal thickening of all myocardial segments.Left Ventricular Ejection Fraction (post stress, in the resting state) =??83 %. Left Ventricular End Diastolic Volume: 48 mL  SUMMARY/OPINION:??This study is normal with no evidence of significant myocardial ischemia. ?Left ventricular function is hyperdynamic with no regional wall motion abnormalities. ?There are no high risk prognostic indicators present. ?The pharmacologic ECG portion of the study is negative for ischemia.There are no prior studies available for comparison. In aggregate the current study is low risk in regards to predicted annual cardiovascular mortality rate.    Cardiovascular Health Factors  Vitals BP Readings from Last 3 Encounters:   05/29/21 (!) 148/94   11/14/20 128/82   11/01/20 131/89     Wt Readings from Last 3 Encounters:   05/29/21 98.2 kg (216 lb 9.6 oz)   11/14/20 95.3 kg (210 lb)   11/01/20 96 kg (211 lb 8.6 oz)     BMI Readings from Last 3 Encounters:   05/29/21 34.96 kg/m?   11/14/20 33.89 kg/m?   11/01/20 35.20 kg/m?      Smoking Social History     Tobacco Use   Smoking Status Every Day   ? Packs/day: 1.00   ? Types: Cigarettes   Smokeless Tobacco Never      Lipid Profile Cholesterol   Date Value Ref Range Status   05/18/2021 177  Final     HDL   Date Value Ref Range Status   05/18/2021 52  Final     LDL   Date Value Ref Range Status  05/18/2021 105 (H) <100 Final     Triglycerides   Date Value Ref Range Status   05/18/2021 103  Final      Blood Sugar No results found for: HGBA1C  Glucose   Date Value Ref Range Status   11/27/2020 95  Final   08/15/2020 98  Final   07/22/2019 101  Final          Problems Addressed Today  Encounter Diagnoses   Name Primary?   ? Poorly-controlled hypertension Yes   ? Screening for heart disease    ? Tobacco abuse    ? Dyspnea, unspecified type        Assessment and Plan    Ms. Tucci appears stable from a cardiovascular perspective.  She reports no angina or congestive symptoms and her blood pressure is well controlled when she checks it at home.  Alternatives for the treatment of hypercholesterolemia were reviewed with the patient.  She tolerates only a small dose of rosuvastatin because of myalgias.  She wanted to add Zetia to her medical regimen.  Possible adverse effects associated with Zetia were reviewed with the patient and she was asked to report any side effects.  The patient was given a requisition to check her fasting lipid profile, and ALT 3 months after starting Zetia. Regular mild aerobic exercise, weight loss and adherence to a heart healthy diet were recommended.  I have asked her to return for follow-up in 12 months time. The total time spent during this interview and exam was 30 minutes.         Current Medications (including today's revisions)  ? cholecalciferol (vitamin D3) (VITAMIN D3) 50 mcg (2,000 unit) tablet Take one tablet by mouth daily.   ? escitalopram oxalate (LEXAPRO) 10 mg tablet Take one tablet by mouth at bedtime daily.   ? ezetimibe (ZETIA) 10 mg tablet Take one tablet by mouth daily.   ? LORazepam (ATIVAN) 0.5 mg tablet Take one tablet by mouth at bedtime as needed for Nausea.   ? meloxicam (MOBIC) 15 mg tablet Take one tablet by mouth at bedtime daily.   ? omeprazole DR (PRILOSEC) 20 mg capsule Take one capsule by mouth daily.   ? rosuvastatin (CRESTOR) 5 mg tablet Take one tablet by mouth daily.   ? vit C/E/Zn/coppr/lutein/zeaxan (PRESERVISION AREDS-2 PO) Take 2 tablets by mouth daily.

## 2021-05-29 NOTE — Patient Instructions
Thank you for visiting our office today.    We would like to make the following medication adjustments:      Start Zetia 10mg  daily    **Check fasting labs in 3 months**      Otherwise continue the same medications as you have been doing.          We will be pursuing the following tests after your appointment today:       Orders Placed This Encounter    LIPID PROFILE    ALT (SGPT)    ezetimibe (ZETIA) 10 mg tablet         We will plan to see you back in 12 months.  Please call in the meantime with any questions or concerns.        Please allow 5-7 business days for our providers to review your results. All normal results will go to MyChart. If you do not have Mychart, it is strongly recommended to get this so you can easily view all your results. If you do not have mychart, we will attempt to call you once with normal lab and testing results. If we cannot reach you by phone with normal results, we will send you a letter.  If you have not heard the results of your testing after one week please give [place] a call.       Your Cardiovascular Medicine [place] Team Gabriel Rung, Brett Canales, Pilar Jarvis, and [place])  phone number is [phone].

## 2021-07-04 ENCOUNTER — Encounter
Admit: 2021-07-04 | Discharge: 2021-07-04 | Payer: MEDICARE | Primary: Student in an Organized Health Care Education/Training Program

## 2021-07-13 ENCOUNTER — Encounter
Admit: 2021-07-13 | Discharge: 2021-07-13 | Payer: MEDICARE | Primary: Student in an Organized Health Care Education/Training Program

## 2021-08-20 ENCOUNTER — Encounter
Admit: 2021-08-20 | Discharge: 2021-08-20 | Payer: MEDICARE | Primary: Student in an Organized Health Care Education/Training Program

## 2022-06-24 ENCOUNTER — Encounter
Admit: 2022-06-24 | Discharge: 2022-06-24 | Payer: MEDICARE | Primary: Student in an Organized Health Care Education/Training Program

## 2022-06-24 NOTE — Telephone Encounter
Received a call from Dr. Isaac Bliss office requesting cardiac clearance for [facility].  Ariel Lee is scheduled for a skin mass excision on 5/29 in their office and they are requesting cardiac clearance.  Pt was last seen over a year ago - 05/29/21.  She has follow up scheduled with Dr. Arna Medici in August.  There are no openings to see Dr. Arna Medici prior to her surgical procedure.  Discussed with patient.  She is agreeable to seeing alternate provider.  Scheduled pt with first available opening with Dr. Art Buff in [place].  Pt is agreeable to plan. .Will callback with any questions, concerns or problems.

## 2022-06-24 NOTE — Telephone Encounter
Dr. Isaac Bliss office called back and states that they are no longer needing cardiac clearance for this procedure.  Called and discussed with patient and cancelled appt on 5/10.  Pt is agreeable to plan.  Will callback with any questions, concerns or problems.

## 2022-07-22 ENCOUNTER — Encounter
Admit: 2022-07-22 | Discharge: 2022-07-22 | Payer: MEDICARE | Primary: Student in an Organized Health Care Education/Training Program

## 2022-07-22 DIAGNOSIS — M79671 Pain in right foot: Secondary | ICD-10-CM

## 2022-07-23 ENCOUNTER — Ambulatory Visit
Admit: 2022-07-23 | Discharge: 2022-07-23 | Payer: MEDICARE | Primary: Student in an Organized Health Care Education/Training Program

## 2022-07-23 ENCOUNTER — Encounter
Admit: 2022-07-23 | Discharge: 2022-07-23 | Payer: MEDICARE | Primary: Student in an Organized Health Care Education/Training Program

## 2022-07-23 DIAGNOSIS — M79671 Pain in right foot: Secondary | ICD-10-CM

## 2022-07-24 ENCOUNTER — Encounter
Admit: 2022-07-24 | Discharge: 2022-07-24 | Payer: MEDICARE | Primary: Student in an Organized Health Care Education/Training Program

## 2022-10-07 ENCOUNTER — Encounter
Admit: 2022-10-07 | Discharge: 2022-10-07 | Payer: MEDICARE | Primary: Student in an Organized Health Care Education/Training Program

## 2022-10-08 ENCOUNTER — Encounter
Admit: 2022-10-08 | Discharge: 2022-10-08 | Payer: MEDICARE | Primary: Student in an Organized Health Care Education/Training Program

## 2022-10-10 ENCOUNTER — Encounter
Admit: 2022-10-10 | Discharge: 2022-10-10 | Payer: MEDICARE | Primary: Student in an Organized Health Care Education/Training Program

## 2022-10-10 DIAGNOSIS — M1990 Unspecified osteoarthritis, unspecified site: Secondary | ICD-10-CM

## 2022-10-10 DIAGNOSIS — R0600 Dyspnea, unspecified: Secondary | ICD-10-CM

## 2022-10-10 DIAGNOSIS — F32A Depression: Secondary | ICD-10-CM

## 2022-10-10 DIAGNOSIS — I10 Essential (primary) hypertension: Secondary | ICD-10-CM

## 2022-10-10 DIAGNOSIS — G4730 Sleep apnea, unspecified: Secondary | ICD-10-CM

## 2022-10-10 DIAGNOSIS — F419 Anxiety disorder, unspecified: Secondary | ICD-10-CM

## 2022-10-10 DIAGNOSIS — Z136 Encounter for screening for cardiovascular disorders: Secondary | ICD-10-CM

## 2022-10-10 MED ORDER — AMLODIPINE 2.5 MG PO TAB
2.5 mg | ORAL_TABLET | Freq: Every day | ORAL | 1 refills | Status: AC
Start: 2022-10-10 — End: ?

## 2022-10-10 NOTE — Progress Notes
Date of Service: 10/10/2022    Ariel Lee is a 72 y.o. female.       HPI     Ariel Lee has been followed for hypertension and hypercholesterolemia.  She tells me that she had a benign growth removed from her left thigh area several months ago without complication.  In the past she has tried taking amlodipine for hypertension but stopped taking it when her blood pressure lowered.  She only tolerates 5 mg of rosuvastatin daily develops myalgias when her dose is increased to 10 mg daily.  Zetia has been recommended and prescribed in the past but she is not currently taking it.  Ariel Lee reports that she her vision has been stable.  She has occasionally notices mild vertigo.  She denies any hearing loss or tinnitus. Ariel Lee reports that her blood pressure routinely runs 130 to 140 mmHg systolic when she checks it at home.  She and her friends spend winters near [place].  Otherwise from a cardiovascular perspective, the patient has been stable and reports no angina, congestive symptoms, palpitations, sensation of sustained forceful heart pounding, lightheadedness or syncope. Her exercise tolerance has been stable, although she does not have a regular exercise program.  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  The patient reports no myalgias, bleeding abnormalities, neurologic motor abnormalities or difficulty with speech. She has a long history of cigarette smoking and is still smoking 1 pack/day.   Historically, Ariel Lee had several orthopedic procedures in 2020.  In January 2020 she underwent left knee arthroplasty and in February 2020 she underwent right knee arthroplasty.  On Jun 24, 2018 she underwent right 2nd metatarsal head excision., right 3rd metatarsal head excision and right 2nd proximal interphalangeal joint fusion for correction of  right foot dislocated 2nd and 3rd metatarsophalangeal joints, metatarsophalangeal arthrosis, and 2nd hammertoe deformity. She underwent reverse right shoulder replacement on December 08, 2020 with excellent results.        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   HENT: Negative.     Eyes: Negative.    Cardiovascular: Negative.    Respiratory: Negative.     Endocrine: Negative.    Hematologic/Lymphatic: Negative.    Skin: Negative.    Musculoskeletal: Negative.    Gastrointestinal: Negative.    Genitourinary: Negative.    Neurological: Negative.    Psychiatric/Behavioral: Negative.     Allergic/Immunologic: Negative.        Physical Exam  GENERAL: The patient is well developed, well nourished, resting comfortably and in no distress.   HEENT: No abnormalities of the visible oro-nasopharynx, conjunctiva or sclera are noted.  NECK: There  is no jugular venous distension. Carotids are palpable and without bruits. There is no thyroid enlargement.  Chest: Lung fields are clear to auscultation. There are no wheezes or crackles.  CV: There is a regular rhythm. The first and second heart sounds are normal.  A grade 2/6 systolic ejection murmur is heard loudest in the right upper sternal border.  There are no diastolic murmurs, gallops or rubs.  Her apical heart rate is 60 bpm.  ABD: The abdomen is soft and supple with normal bowel sounds. There is no hepatosplenomegaly, ascites, tenderness, masses or bruits.  Neuro: There are no focal motor defects. Ambulation is normal. Cognitive function appears normal.  Ext: There is no edema or evidence of deep vein thrombosis. Peripheral pulses are satisfactory.  Improving incision near the right second metatarsal.  SKIN: There are no rashes and no cellulitis  PSYCH: The patient is calm, rationale and oriented.    Cardiovascular Studies  A twelve-lead ECG obtained on 10/10/2022 reveals mild sinus bradycardia with a heart rate of 58 bpm.  Left axis deviation is noted along with low precordial voltage.  Incomplete right bundle branch block is seen.  Echo Doppler 11/01/2020:  Interpretation Summary     No regional wall motion abnormalities are seen. Overall left ventricular systolic function appears normal and dynamic. The estimated left ventricular ejection fraction is 65%.  Left ventricular contractility appears similar when compared with the prior echocardiogram performed on 09/09/18.   Normal left ventricular diastolic function.  Right ventricular chamber dimensions and contractility appear normal.  Normal atrial chamber dimensions.  Mild mitral annular calcification is noted.  Otherwise, cardiac valve structures are unremarkable. There is no evidence of significant valvular regurgitation or stenosis by doppler exam.  No pericardial effusion is seen.     Regadenoson thallium stress test 10/11/2020:  Scintigraphic (planar/tomographic): Tomographic images were reconstructed in three orthogonal views.  There is normal homogenous uptake of thallium in all myocardial segments.  There are no perfusion defects.  Polar coordinate map identifies no statistically significant perfusion abnormalities.TID Ratio:  0.8  (normal <1.36). Summed Stress Score:  0   , Summed Rest Score:  0. Regional Wall Thickening and Motion Post Stress:   Left ventricular function is hyperdynamic with no regional wall motion abnormalities.  There is normal thickening of all myocardial segments.Left Ventricular Ejection Fraction (post stress, in the resting state) =  83 %. Left Ventricular End Diastolic Volume: 48 mL  SUMMARY/OPINION:  This study is normal with no evidence of significant myocardial ischemia.  Left ventricular function is hyperdynamic with no regional wall motion abnormalities.  There are no high risk prognostic indicators present.  The pharmacologic ECG portion of the study is negative for ischemia.There are no prior studies available for comparison. In aggregate the current study is low risk in regards to predicted annual cardiovascular mortality rate.  Cardiovascular Health Factors  Vitals BP Readings from Last 3 Encounters:   10/10/22 (!) 129/102   05/29/21 (!) 148/94   11/14/20 128/82     Wt Readings from Last 3 Encounters:   10/10/22 95.7 kg (211 lb)   05/29/21 98.2 kg (216 lb 9.6 oz)   11/14/20 95.3 kg (210 lb)     BMI Readings from Last 3 Encounters:   10/10/22 35.11 kg/m?   05/29/21 34.96 kg/m?   11/14/20 33.89 kg/m?      Smoking Social History     Tobacco Use   Smoking Status Every Day    Current packs/day: 1.00  Types: Cigarettes   Smokeless Tobacco Never      Lipid Profile Cholesterol   Date Value Ref Range Status   11/27/2021 162  Final     HDL   Date Value Ref Range Status   11/27/2021 51  Final     LDL   Date Value Ref Range Status   11/27/2021 95  Final     Triglycerides   Date Value Ref Range Status   11/27/2021 82  Final      Blood Sugar No results found for: HGBA1C  Glucose   Date Value Ref Range Status   11/27/2021 101  Final   11/27/2020 95  Final   08/15/2020 98  Final          Problems Addressed Today  Encounter Diagnoses   Name Primary?    Screening for heart disease Yes    Poorly-controlled hypertension     Dyspnea, unspecified type     Sleep apnea, unspecified type        Assessment and Plan      Ms. Deitrick appears stable from a cardiovascular perspective.  She reports no angina or congestive symptoms.  She has mild hypertension and would like to consider treatment for it.  I gave her several options which included a small dose of amlodipine, a small dose of diuretic or a small dose of losartan.  She wanted to retry taking amlodipine 2.5 mg daily.  She did not want to change her lipid-lowering therapy at this time.  Complete abstinence from cigarette smoking was strongly recommended.  Cardiovascular risk factor management was reviewed in great detail.  Regular mild aerobic exercise, weight loss and adherence to a heart healthy diet were recommended.  I have asked her to return for follow-up in 12 months time. The total time spent during this interview and exam was 30 minutes.          Current Medications (including today's revisions)   amLODIPine (NORVASC) 2.5 mg tablet Take one tablet by mouth daily.    cholecalciferol (vitamin D3) (VITAMIN D3) 50 mcg (2,000 unit) tablet Take one tablet by mouth daily.    escitalopram oxalate (LEXAPRO) 10 mg tablet Take one tablet by mouth at bedtime daily.    LORazepam (ATIVAN) 0.5 mg tablet Take one tablet by mouth at bedtime as needed for Nausea.    meloxicam (MOBIC) 15 mg tablet Take one tablet by mouth at bedtime daily.    omeprazole DR (PRILOSEC) 20 mg capsule Take one capsule by mouth daily.    ondansetron HCL (ZOFRAN) 4 mg tablet Take one tablet by mouth as Needed.    rosuvastatin (CRESTOR) 5 mg tablet Take one tablet by mouth daily.    vit C/E/Zn/coppr/lutein/zeaxan (PRESERVISION AREDS-2 PO) Take 2 tablets by mouth daily.

## 2022-10-10 NOTE — Patient Instructions
Thank you for visiting our office today.    We would like to make the following medication adjustments:    Amlodipine 2.5mg  daily   Continue to monitor blood pressure regularly. We want the majority of your readings around 130s/80s       Otherwise continue the same medications as you have been doing.          We will be pursuing the following tests after your appointment today:       Orders Placed This Encounter    AMB REFERRAL TO PULMONARY    ECG 12-LEAD    amLODIPine (NORVASC) 2.5 mg tablet         We will plan to see you back in 12 months.  Please call us in the meantime with any questions or concerns.        Please allow 5-7 business days for our providers to review your results. All normal results will go to MyChart. If you do not have Mychart, it is strongly recommended to get this so you can easily view all your results. If you do not have mychart, we will attempt to call you once with normal lab and testing results. If we cannot reach you by phone with normal results, we will send you a letter.  If you have not heard the results of your testing after one week please give [place] a call.       Your Cardiovascular Medicine [place] [facility] Team Brett Canales, Pilar Jarvis, Shawna Orleans, and [place])  phone number is [phone].

## 2023-03-24 ENCOUNTER — Encounter
Admit: 2023-03-24 | Discharge: 2023-03-24 | Payer: MEDICARE | Primary: Student in an Organized Health Care Education/Training Program

## 2023-03-24 MED ORDER — AMLODIPINE 2.5 MG PO TAB
2.5 mg | ORAL_TABLET | Freq: Every day | ORAL | 1 refills | Status: AC
Start: 2023-03-24 — End: ?

## 2023-06-28 ENCOUNTER — Encounter
Admit: 2023-06-28 | Discharge: 2023-06-28 | Payer: MEDICARE | Primary: Student in an Organized Health Care Education/Training Program

## 2023-06-30 ENCOUNTER — Encounter
Admit: 2023-06-30 | Discharge: 2023-06-30 | Payer: MEDICARE | Primary: Student in an Organized Health Care Education/Training Program

## 2023-09-11 ENCOUNTER — Encounter
Admit: 2023-09-11 | Discharge: 2023-09-11 | Payer: MEDICARE | Primary: Student in an Organized Health Care Education/Training Program

## 2023-09-11 NOTE — Telephone Encounter
 Patient called nursing line reporting that she has been having episodes of jaw pain over the past couple of weeks with minimal exertion. She states that she notices it only with exertion for example out doing yard work. She states the jaw pain feels like pressure that does not radiate. She states the pain goes away within minutes of resting. She denies any other associated symptoms of chest pain, back pain, shortness of breath, diaphoresis, etc. Ariel Lee states currently she is completely asymptomatic and feels like her symptoms are stable. She has had about 3 episodes in 2 weeks. Ariel Lee would like a sooner appointment with Dr. Debroah to see if she needs cardiac testing.       Most recent testing was completed in 2022- MPI was nonischemic and echo with EF 65%.   Last OV with Dr. Debroah 10/10/22    Educated patient about warning signs and symptoms and when to report to the ER. Patient feels she is stable at this point in time.

## 2023-09-15 ENCOUNTER — Encounter
Admit: 2023-09-15 | Discharge: 2023-09-15 | Payer: MEDICARE | Primary: Student in an Organized Health Care Education/Training Program

## 2023-09-16 ENCOUNTER — Encounter
Admit: 2023-09-16 | Discharge: 2023-09-16 | Payer: MEDICARE | Primary: Student in an Organized Health Care Education/Training Program

## 2023-09-16 ENCOUNTER — Ambulatory Visit
Admit: 2023-09-16 | Discharge: 2023-09-16 | Payer: MEDICARE | Primary: Student in an Organized Health Care Education/Training Program

## 2023-09-16 DIAGNOSIS — Z136 Encounter for screening for cardiovascular disorders: Principal | ICD-10-CM

## 2023-09-16 DIAGNOSIS — R6884 Jaw pain: Secondary | ICD-10-CM

## 2023-09-16 DIAGNOSIS — I10 Essential (primary) hypertension: Secondary | ICD-10-CM

## 2023-09-16 DIAGNOSIS — I209 Angina pectoris, unspecified: Secondary | ICD-10-CM

## 2023-09-16 DIAGNOSIS — R0600 Dyspnea, unspecified: Secondary | ICD-10-CM

## 2023-09-16 LAB — BASIC METABOLIC PANEL
ANION GAP: 7 — ABNORMAL LOW (ref 8–16)
BLD UREA NITROGEN: 21 — ABNORMAL HIGH (ref 9.8–20.1)
CALCIUM: 9.7
CHLORIDE: 108 — ABNORMAL HIGH (ref 98–107)
CO2: 26
CREATININE: 1
GLUCOSE,PANEL: 112 — ABNORMAL HIGH (ref 70–105)
POTASSIUM: 4.1
SODIUM: 141

## 2023-09-16 LAB — CBC
HEMATOCRIT: 46 — ABNORMAL HIGH (ref 34.1–44.9)
HEMOGLOBIN: 15
MCH: 29
MCHC: 32 — ABNORMAL LOW (ref 32.2–35.5)
MCV: 90
MPV: 9.2 — ABNORMAL LOW (ref 9.4–12.4)
PLATELET COUNT: 272
RBC COUNT: 5.1
WBC COUNT: 8.7

## 2023-09-16 MED ORDER — EZETIMIBE 10 MG PO TAB
10 mg | ORAL_TABLET | Freq: Every day | ORAL | 1 refills | 90.00000 days | Status: AC
Start: 2023-09-16 — End: ?

## 2023-09-16 MED ORDER — AMLODIPINE 2.5 MG PO TAB
2.5 mg | ORAL_TABLET | Freq: Two times a day (BID) | ORAL | 1 refills | 90.00000 days | Status: AC
Start: 2023-09-16 — End: ?

## 2023-09-16 MED ORDER — NITROGLYCERIN 0.4 MG SL SUBL
.4 mg | ORAL_TABLET | SUBLINGUAL | 3 refills | 9.00000 days | Status: AC | PRN
Start: 2023-09-16 — End: ?

## 2023-09-16 NOTE — Patient Instructions
 CARDIAC CATHETERIZATION   PRE-ADMISSION INSTRUCTIONS    Patient Name: Ariel Lee  MRN#: [id number]  Date of Birth: 1950/02/26 (73 y.o.)  Today's Date: 09/16/2023    PROCEDURE:  You are scheduled for a Coronary Angiogram with possible Angioplasty/Stenting with Dr. Norleen Hudson.    PROCEDURE DATE AND ARRIVAL TIME:  Your procedure date is 09/19/23.  You will receive a call from the Cath lab staff between 8:00 a.m. and noon on the business day prior to your procedure to let you know at what time to arrive on the day of your procedure.    Please check in at the Admitting Desk in the [facility] for your procedure.   Address:  [address], [place], [place] [address]    [facility] Entrance and take a right. Continue down the hallway past the Cardiovascular Medicine office. That hall will take you into the [facility]. Check in at the desk on the left side.)     (If you have further questions regarding your arrival time for the CV lab, please call [phone] by 3:00pm the day before your procedure. Please leave a message with your name and number, your call will be returned in a timely manner.)    PRE-PROCEDURE APPOINTMENTS:  To be done 7/29   Pre-Admission lab work required within 14 days of procedure: BMP and CBC at the [facility] [place] clinic.          FOOD AND DRINK INSTRUCTIONS  If not taking oral or injectable GLP-1 medication: Nothing to eat after 11p.m. the evening before your procedure. Take your prescription medications with a sip of water as instructed. No caffeine for 24 hours prior to your procedure. You will be under moderate sedation for your procedure.  You may drink clear liquids up to an hour before hospital arrival. This will be confirmed by the Cath lab staff the day before your procedure.     SPECIAL MEDICATION INSTRUCTIONS  Any new prescriptions will be sent to your pharmacy listed on file with us .    Is the patient taking oral anticoagulation medication (NOAC, DOAC, or Warfarin)?: No    Does the ordering provider want a Lovenox bridge before/after cath? N/A    TAKE AM OF PROCEDURE:  Please take either 4 baby aspirin (4 x 81mg ) or one full strength NON-COATED 325mg  aspirin.  In addition to the aspirin, if you take Clopidogrel (Plavix), Prasugrel (Effient), or Ticagrelor (Brilinta), please take your scheduled dose the morning of the procedure.  TAKE ALL other medications not discussed above as prescribed. If you are prescribed new medications prior to your procedure or are taking medications that aren't on the list below, please let us  know so we can review prior to your procedure.       HOLD ALL erectile dysfunction medications for 3 days, unless prescribed for pulmonary hypertension.  HOLD ALL over the counter vitamins or supplements on the morning of your procedure.      Additional Instructions  If you wear CPAP, please bring your mask and machine with you to the hospital.    Take a bath or shower with anti-bacterial soap the evening before, or the morning of the procedure.     Bring photo ID and your health insurance card(s).    Arrange for a driver to take you home from the hospital. Please arrange for a friend or family member to take you home from this test. You cannot take a Taxi, [place], or public transportation as  there has to be a responsible person to help care for you after sedation    Bring an accurate list of your current medications with you to the hospital (all medications and supplements taken daily).  Please use the medication list below and write in the date and time when you took your last dose before your procedure. Update this list of medications as needed.      Wear comfortable clothes and don't bring valuables, other than photo identification card, with you to the hospital.    Please pack a bag for an overnight stay.     For patients who are staying overnight on the Cardiovascular Treatment and Recovery Unit (CTR), no visitor(s) will be allowed to sleep at the bedside.    Please review your pre-procedure instructions and bring them with you on the day of your procedure.  Call the office at [phone] with any questions. You may ask to speak with Dr. Elspeth Mate nurse.      ALLERGIES  Allergies   Allergen Reactions    Pcn [Penicillins] HIVES    Succinylcholine SEE COMMENTS      pt states cannot move or breathe but can hear for 12 hrs, reversal drug does not work        CURRENT MEDICATIONS  Outpatient Encounter Medications as of 09/16/2023   Medication Sig Dispense Refill    amLODIPine  (NORVASC ) 2.5 mg tablet Take one tablet by mouth twice daily. 90 tablet 1    cholecalciferol (vitamin D3) (VITAMIN D3) 50 mcg (2,000 unit) tablet Take one tablet by mouth daily.      escitalopram oxalate (LEXAPRO) 10 mg tablet Take one tablet by mouth at bedtime daily.      ezetimibe  (ZETIA ) 10 mg tablet Take one tablet by mouth daily. 90 tablet 1    LORazepam (ATIVAN) 0.5 mg tablet Take one tablet by mouth at bedtime as needed for Nausea.      meloxicam (MOBIC) 15 mg tablet Take one tablet by mouth at bedtime daily.      nitroglycerin  (NITROSTAT ) 0.4 mg tablet Place one tablet under tongue every 5 minutes as needed for Chest Pain. Max of 3 tablets, call 911. 25 tablet 3    omeprazole DR (PRILOSEC) 20 mg capsule Take one capsule by mouth daily.      ondansetron  HCL (ZOFRAN ) 4 mg tablet Take one tablet by mouth as Needed.      rosuvastatin  (CRESTOR ) 5 mg tablet Take one tablet by mouth daily.      vit C/E/Zn/coppr/lutein/zeaxan (PRESERVISION AREDS-2 PO) Take 2 tablets by mouth daily.       No facility-administered encounter medications on file as of 09/16/2023.       _________________________________________  Form completed by: Tawni Forge, RN  Date completed: 09/16/23  Method: Via telephone and sent to MyChart.

## 2023-09-16 NOTE — Telephone Encounter
 Called pt to discuss cardiac catheterization per Dr. Debroah.  Pt scheduled 8/1 per her request.  She would like a later check in time since she is coming from [place] a half away.  Informed her that I would put a request in her case prep.  Pt states she did have labs drawn today.  Pre procedure instructions went over thoroughly as pt states she does not use MyChart and I am afraid the instructions would not get to her in time via mail.  She verbalizes understanding and all questions were answered. Callback number provided.

## 2023-09-16 NOTE — Progress Notes
 Date of Service: 09/16/2023    Ariel Lee is a 73 y.o. female.       HPI   Ariel Lee has been followed for multiple vascular risk factors including cigarette smoking, hypertension and hypercholesterolemia.  The patient now reports 3 episodes of exertional jaw discomfort within the past month.  The last 1 occurred on September 11, 2023 in the evening while she was weeding.  The jaw discomfort was bilateral and lasted for several minutes resolving quickly with rest.  Another episode of bilateral jaw tightness occurred on 09/04/2023 while she was climbing steps from the basement.  Once again this persisted for several minutes and resolved with rest.  A prior episode also occurred on 09/02/2023 while pulling weeds.  This was also described as bilateral jaw tightness, lasted several minutes and resolved promptly with rest.  All 3 episodes were associated with dyspnea but not with lightheadedness, or diaphoresis.  No jaw tightness was reported prior to September 02, 2023.  Ariel Lee has not had a prior coronary event or coronary intervention.  She only tolerates 5 mg of rosuvastatin  daily and developed myalgias when her dose was increased to 10 mg daily.  She has occasionally notices mild vertigo.  She denies any hearing loss or tinnitus. Ariel Lee reports that her blood pressure has been mildly elevated when recently checked at home.  She and her friends spend winters near [place] .  Otherwise from a cardiovascular perspective, the patient has been stable and reports no congestive symptoms, palpitations, sensation of sustained forceful heart pounding, lightheadedness or syncope. Her exercise tolerance has been stable, although she does not have a regular exercise program.  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  The patient reports no myalgias, bleeding abnormalities, neurologic motor abnormalities or difficulty with speech. She has a long history of cigarette smoking and is still smoking 1 pack/day.   Historically, Ariel Lee had several orthopedic procedures in 2020.  In January 2020 she underwent left knee arthroplasty and in February 2020 she underwent right knee arthroplasty.  On Jun 24, 2018 she underwent right 2nd metatarsal head excision., right 3rd metatarsal head excision and right 2nd proximal interphalangeal joint fusion for correction of  right foot dislocated 2nd and 3rd metatarsophalangeal joints, metatarsophalangeal arthrosis, and 2nd hammertoe deformity. She underwent reverse right shoulder replacement on December 08, 2020 with excellent results.        Vitals:    09/16/23 0832 09/16/23 0841   BP: (!) 129/103 (!) 134/105   BP Source: Arm, Left Upper Arm, Left Upper   Pulse: 83    SpO2: 96%    O2 Device: None (Room air)    PainSc: Zero    Weight: 93.2 kg (205 lb 6.4 oz)    Height: 165.1 cm (5' 5)      Body mass index is 34.18 kg/m?SABRA     Past Medical History  Patient Active Problem List    Diagnosis Date Noted    Gastroesophageal reflux disease 10/11/2020    Obesity 10/11/2020    Anxiety 05/08/2017    Poorly-controlled hypertension 05/16/2015    Dyspnea 05/16/2015    Jaw pain 05/16/2015    Tobacco abuse 05/16/2015    Osteoarthritis of spine with radiculopathy, 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   HENT: Negative.     Eyes: Negative.    Cardiovascular: Negative.    Respiratory:  Negative.     Endocrine: Negative.    Hematologic/Lymphatic: Negative.    Skin: Negative.    Musculoskeletal:  Positive for joint pain.   Gastrointestinal: Negative.    Genitourinary: Negative.    Neurological: Negative.    Psychiatric/Behavioral: Negative.     Allergic/Immunologic: Negative.        Physical Exam  GENERAL: The patient is well developed, well nourished, resting comfortably and in no distress.   HEENT: No abnormalities of the visible oro-nasopharynx, conjunctiva or sclera are noted.  NECK: There is no jugular venous distension. Carotids are palpable and without bruits. There is no thyroid enlargement.  Chest: Lung fields are clear to auscultation. There are no wheezes or crackles.  CV: There is a regular rhythm. The first and second heart sounds are normal.  A soft systolic ejection murmur is heard at the right upper sternal border.  There are no diastolic murmurs, gallops or rubs.  Her apical heart rate is 64 bpm.  ABD: The abdomen is soft and supple with normal bowel sounds. There is no hepatosplenomegaly, ascites, tenderness, masses or bruits.  Neuro: There are no focal motor defects. Ambulation is normal. Cognitive function appears normal.  Ext: There is no edema or evidence of deep vein thrombosis. Peripheral pulses are satisfactory.    SKIN: There are no rashes and no cellulitis  PSYCH: The patient is calm, rationale and oriented.    Cardiovascular Studies  A twelve-lead ECG was obtained on 09/16/2023 and reveals normal sinus rhythm with a heart rate of 62 bpm.  Left axis deviation is noted along with incomplete right bundle branch block.  Low precordial voltage is seen.    Cardiovascular Health Factors  Vitals BP Readings from Last 3 Encounters:   09/16/23 (!) 134/105   10/10/22 (!) 129/102   05/29/21 (!) 148/94     Wt Readings from Last 3 Encounters:   09/16/23 93.2 kg (205 lb 6.4 oz)   10/10/22 95.7 kg (211 lb)   05/29/21 98.2 kg (216 lb 9.6 oz)     BMI Readings from Last 3 Encounters:   09/16/23 34.18 kg/m?   10/10/22 35.11 kg/m?   05/29/21 34.96 kg/m?      Smoking Social History     Tobacco Use   Smoking Status Every Day    Current packs/day: 1.00    Types: Cigarettes   Smokeless Tobacco Never      Lipid Profile Cholesterol   Date Value Ref Range Status   12/06/2022 166  Final     HDL   Date Value Ref Range Status   12/06/2022 44  Final     LDL   Date Value Ref Range Status   12/06/2022 99  Final     Triglycerides   Date Value Ref Range Status   12/06/2022 117  Final      Blood Sugar No results found for: HGBA1C  Glucose   Date Value Ref Range Status   12/06/2022 103  Final   11/27/2021 101  Final   11/27/2020 95  Final          Problems Addressed Today  Encounter Diagnoses   Name Primary?    Screening for heart disease Yes    Poorly-controlled hypertension     Jaw pain     Dyspnea, unspecified type     Angina pectoris        Assessment and Plan   Ms. Notarianni's 3 episodes of exertional jaw discomfort are classic for new onset exertional  angina at relatively low workload, interfering with her daily activities.  I do not think that a stress test is necessary because her diagnosis would still be angina pectoris.  Her jaw discomfort is strictly exertional, resolving promptly with rest. Diagnostic and therapeutic alternatives were presented to the patient and she wanted to proceed with coronary angiography and revascularization of amenable coronary lesions.  A clinician patient was discussion was held today concerning coronary angiography and intervention and the risks and benefits of the procedure were reviewed.  She wants to proceed with coronary angiography/intervention and I have asked that this be scheduled as quickly as possible.  I have asked her to limit her activities until she undergoes coronary angiography/intervention.  Alternatives to the treatment of angina and hypertension were reviewed with the patient and she wanted to increase her amlodipine  to 2.5 mg twice daily.  She was given a prescription for sublingual nitroglycerin  with instructions for use.  Her soft systolic murmur is likely functional or may be related to aortic valve sclerosis but I have asked her to obtain an echo Doppler study prior to coronary angiography/intervention.  Alternatives to the treatment of hypercholesterolemia were reviewed with the patient and she wanted to add Zetia  to her lipid-lowering therapy.  She may end up on Repatha if we cannot get her LDL cholesterol to goal.  She reports that she is on her maximum tolerated dose of rosuvastatin  and does not want to increase the dose further.  The patient was instructed to call 911 and present immediately to the emergency room if she notices any worrisome symptoms such as progression of chest discomfort, significant dyspnea or lightheadedness.  She has had no angina since she has been taking it easy after her last episode of exertional jaw tightness on 09/11/2023.  Complete abstinence from cigarette smoking was strongly reinforced.  I have asked her to return to general cardiology clinic within the next 3 months. The total time spent during this interview and exam with preparation and chart review was 45 minutes.         Current Medications (including today's revisions)   amLODIPine  (NORVASC ) 2.5 mg tablet Take one tablet by mouth twice daily.    cholecalciferol (vitamin D3) (VITAMIN D3) 50 mcg (2,000 unit) tablet Take one tablet by mouth daily.    escitalopram oxalate (LEXAPRO) 10 mg tablet Take one tablet by mouth at bedtime daily.    ezetimibe  (ZETIA ) 10 mg tablet Take one tablet by mouth daily.    LORazepam (ATIVAN) 0.5 mg tablet Take one tablet by mouth at bedtime as needed for Nausea.    meloxicam (MOBIC) 15 mg tablet Take one tablet by mouth at bedtime daily.    nitroglycerin  (NITROSTAT ) 0.4 mg tablet Place one tablet under tongue every 5 minutes as needed for Chest Pain. Max of 3 tablets, call 911.    omeprazole DR (PRILOSEC) 20 mg capsule Take one capsule by mouth daily.    ondansetron  HCL (ZOFRAN ) 4 mg tablet Take one tablet by mouth as Needed.    rosuvastatin  (CRESTOR ) 5 mg tablet Take one tablet by mouth daily.    vit C/E/Zn/coppr/lutein/zeaxan (PRESERVISION AREDS-2 PO) Take 2 tablets by mouth daily.

## 2023-09-16 NOTE — Patient Instructions
 Thank you for visiting our office today.    We would like to make the following medication adjustments:      Amlodipine  2.5mg  twice daily  Nitro 0.4mg  as needed  Zetia  10mg  daily       Otherwise continue the same medications as you have been doing.          We will be pursuing the following tests after your appointment today:       Orders Placed This Encounter    BASIC METABOLIC PANEL    CBC    ECG 12-LEAD    2D + DOPPLER ECHO    ezetimibe  (ZETIA ) 10 mg tablet    nitroglycerin  (NITROSTAT ) 0.4 mg tablet    amLODIPine  (NORVASC ) 2.5 mg tablet         We will plan to see you back in 3 months.  Please call us  in the meantime with any questions or concerns.        Please allow 5-7 business days for our providers to review your results. All normal results will go to MyChart. If you do not have Mychart, it is strongly recommended to get this so you can easily view all your results. If you do not have mychart, we will attempt to call you once with normal lab and testing results. If we cannot reach you by phone with normal results, we will send you a letter.  If you have not heard the results of your testing after one week please give us  a call.       Your Cardiovascular [facility] [place] Team [place], [place], [place], and [place])  phone number is [phone].

## 2023-09-16 NOTE — Telephone Encounter
-----   Message from [place] C sent at 09/16/2023 10:25 AM CDT -----  Regarding: RE: LHC  Awesome thank you so much!!! And yes! She should be getting them today in [facility]!  ----- Message -----  From: Willo Maus, RN  Sent: 09/16/2023  10:23 AM CDT  To: Larraine Mackintosh, RN  Subject: RE: LHC                                          Hi!    I have her scheduled 8/1 with [facility] per your request.  I will call her soon to go through her procedural instructions.  Was she going to grab labs today?  Thanks,  Bari, RN  ----- Message -----  From: Mackintosh Larraine, RN  Sent: 09/16/2023   9:30 AM CDT  To: Cvm Nurse Ep-Cath Lab Scheduling  Subject: LHC                                              Hello,     This patient just saw Dr. Debroah this morning having angina. He is wanting a LHC/poss soon. Patient did confirm she could do this Friday 09/19/23 with [facility]. I will print off her lab orders if someone could help get her scheduled and call later with instructions.     Thank you for your help!!    Larraine, RN

## 2023-09-17 ENCOUNTER — Encounter
Admit: 2023-09-17 | Discharge: 2023-09-17 | Payer: MEDICARE | Primary: Student in an Organized Health Care Education/Training Program

## 2023-09-17 ENCOUNTER — Ambulatory Visit
Admit: 2023-09-17 | Discharge: 2023-09-17 | Payer: MEDICARE | Primary: Student in an Organized Health Care Education/Training Program

## 2023-09-17 NOTE — Progress Notes
 Medicare is listed as patient's primary insurance coverage.  Pre-certification is not required for hospitalizations.

## 2023-09-18 ENCOUNTER — Encounter
Admit: 2023-09-18 | Discharge: 2023-09-18 | Payer: MEDICARE | Primary: Student in an Organized Health Care Education/Training Program

## 2023-09-18 NOTE — Telephone Encounter
-----   Message from GORMAN Alexander, MD sent at 09/18/2023 10:32 AM CDT -----  Larraine armin Kemps: Favorable echo Doppler study obtained prior to coronary angiography/intervention.  Please let her know.  Thanks.  SBG  ----- Message -----  From: Rondal Clubs, MD  Sent: 09/17/2023   4:10 PM CDT  To: Elspeth KATHEE Alexander, MD

## 2023-09-19 ENCOUNTER — Encounter
Admit: 2023-09-19 | Discharge: 2023-09-19 | Payer: MEDICARE | Primary: Student in an Organized Health Care Education/Training Program

## 2023-09-19 ENCOUNTER — Ambulatory Visit
Admit: 2023-09-19 | Discharge: 2023-09-20 | Payer: MEDICARE | Primary: Student in an Organized Health Care Education/Training Program

## 2023-11-16 ENCOUNTER — Observation Stay
Admit: 2023-11-16 | Discharge: 2023-11-17 | Payer: MEDICARE | Primary: Student in an Organized Health Care Education/Training Program

## 2023-11-16 ENCOUNTER — Observation Stay
Admit: 2023-11-16 | Discharge: 2023-11-16 | Payer: MEDICARE | Primary: Student in an Organized Health Care Education/Training Program

## 2023-11-16 ENCOUNTER — Encounter
Admit: 2023-11-16 | Discharge: 2023-11-16 | Payer: MEDICARE | Primary: Student in an Organized Health Care Education/Training Program

## 2023-11-17 ENCOUNTER — Encounter
Admit: 2023-11-17 | Discharge: 2023-11-17 | Payer: MEDICARE | Primary: Student in an Organized Health Care Education/Training Program

## 2023-11-24 ENCOUNTER — Encounter
Admit: 2023-11-24 | Discharge: 2023-11-24 | Payer: MEDICARE | Primary: Student in an Organized Health Care Education/Training Program

## 2023-11-25 ENCOUNTER — Encounter
Admit: 2023-11-25 | Discharge: 2023-11-25 | Payer: MEDICARE | Primary: Student in an Organized Health Care Education/Training Program

## 2023-11-27 ENCOUNTER — Encounter
Admit: 2023-11-27 | Discharge: 2023-11-27 | Payer: MEDICARE | Primary: Student in an Organized Health Care Education/Training Program

## 2023-11-27 ENCOUNTER — Ambulatory Visit
Admit: 2023-11-27 | Discharge: 2023-11-27 | Payer: MEDICARE | Primary: Student in an Organized Health Care Education/Training Program

## 2023-11-27 VITALS — BP 121/105 | HR 100 | Ht 65.0 in | Wt 198.2 lb

## 2023-11-27 DIAGNOSIS — E785 Hyperlipidemia, unspecified: Principal | ICD-10-CM

## 2023-11-27 DIAGNOSIS — R6884 Jaw pain: Secondary | ICD-10-CM

## 2023-11-27 DIAGNOSIS — Z720 Tobacco use: Secondary | ICD-10-CM

## 2023-11-27 NOTE — Progress Notes
 Date of Service: 11/27/2023    Ariel Lee is a 73 y.o. female.       HPI   Ariel Lee has been followed for multiple vascular risk factors including cigarette smoking, hypertension and hypercholesterolemia.  The patient reported exertional jaw discomfort and underwent coronary angiography on 09/19/2023 which showed only nonobstructive coronary disease.  No intervention was required.  Ariel Lee reports only infrequent exertional jaw discomfort since undergoing coronary angiography.  She then had a syncopal episode on 11/15/2023 while being outside at a wedding on a hot day.  It was likely due to orthostasis and dehydration.  She has had no recurrence of lightheadedness, presyncope or syncope.  She only tolerates 5 mg of rosuvastatin  daily and developed myalgias when her dose was increased to 10 mg daily.  She has occasionally notices mild vertigo.  She denies any hearing loss or tinnitus.  She and her friends spend winters near [place] .  Otherwise from a cardiovascular perspective, the patient has been stable and reports no congestive symptoms, palpitations, sensation of sustained forceful heart pounding, lightheadedness or recurrent syncope. Her exercise tolerance has been stable, although she does not have a regular exercise program.  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  The patient reports no myalgias, bleeding abnormalities, neurologic motor abnormalities or difficulty with speech. She has a long history of cigarette smoking and is still smoking 1 pack/day.   Historically, Ariel Lee had several orthopedic procedures in 2020.  In January 2020 she underwent left knee arthroplasty and in February 2020 she underwent right knee arthroplasty.  On Jun 24, 2018 she underwent right 2nd metatarsal head excision., right 3rd metatarsal head excision and right 2nd proximal interphalangeal joint fusion for correction of  right foot dislocated 2nd and 3rd metatarsophalangeal joints, metatarsophalangeal arthrosis, and 2nd hammertoe deformity. She underwent reverse right shoulder replacement on December 08, 2020 with excellent results.        Vitals:    11/27/23 1408   BP: (!) 121/105   BP Source: Arm, Left Upper   Pulse: 100   SpO2: 97%   O2 Device: None (Room air)   PainSc: Zero   Weight: 89.9 kg (198 lb 3.2 oz)   Height: 165.1 cm (5' 5)     Body mass index is 32.98 kg/m?SABRA     Past Medical History  Patient Active Problem List    Diagnosis Date Noted    NSTEMI (non-ST elevated myocardial infarction) (CMS-HCC) 11/16/2023    Nonobstructive atherosclerosis of coronary artery 09/19/2023    Dyslipidemia 09/19/2023    Gastroesophageal reflux disease 10/11/2020    Obesity 10/11/2020    Anxiety 05/08/2017    Primary hypertension 05/16/2015    Dyspnea 05/16/2015    Jaw pain 05/16/2015    Tobacco abuse 05/16/2015    Osteoarthritis of spine with radiculopathy, 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   HENT: Negative.     Eyes: Negative.    Cardiovascular:  Positive for near-syncope and syncope.   Respiratory: Negative.     Endocrine: Negative.    Hematologic/Lymphatic: Negative.    Skin: Negative.    Musculoskeletal: Negative.    Gastrointestinal: Negative.    Genitourinary: Negative.    Psychiatric/Behavioral: Negative.     Allergic/Immunologic: Negative.        Physical Exam  GENERAL: The patient is well developed, well nourished, resting comfortably and in no  distress.   HEENT: No abnormalities of the visible oro-nasopharynx, conjunctiva or sclera are noted.  NECK: There is no jugular venous distension. Carotids are palpable and without bruits. There is no thyroid enlargement.  Chest: Lung fields are clear to auscultation. There are no wheezes or crackles.  CV: There is a regular rhythm. The first and second heart sounds are normal.  A soft systolic ejection murmur is heard at the right upper sternal border.  There are no diastolic murmurs, gallops or rubs.  Her apical heart rate is 84 bpm.  ABD: The abdomen is soft and supple with normal bowel sounds. There is no hepatosplenomegaly, ascites, tenderness, masses or bruits.  Neuro: There are no focal motor defects. Ambulation is normal. Cognitive function appears normal.  Ext: There is no edema or evidence of deep vein thrombosis. Peripheral pulses are satisfactory.  Right radial artery cath site looks good.  SKIN: There are no rashes and no cellulitis  PSYCH: The patient is calm, rationale and oriented.    Cardiovascular Studies  A twelve-lead ECG was obtained on 09/16/2023 and reveals normal sinus rhythm with a heart rate of 62 bpm. Left axis deviation is noted along with incomplete right bundle branch block. Low precordial voltage is seen.   Echo Doppler 09/17/2023:    Interpretation Summary  LV size and function are normal. LVEF 65% via visual estimation. Moderate concentric LVH. LV longitudinal strain is -19% with a apex to basal pattern of ~1.7.  Grade II LV diastolic dysfunction.   RV is mildly dilated with low normal systolic function.  PASP unable to assessed due to poor TR signal.   Normal size of the atria.   No significant valvular dysfunction.   No pericardial effusion.     Coronary angiography without intervention 09/19/2023:  HEMODYNAMICS:    Left ventricle 122/16 mmHg.  Aorta 107/68 mmHg.     ANGIOGRAPHY:    Left main:  The left main arises from the left sinus in standard fashion and bifurcates distally into the LAD and left circumflex.  There are luminal irregularities in the left main.  Left anterior descending:  The LAD is a medium to large caliber vessel, which tapers as it reaches the apex.  It gives rise to a single very large diagonal branch.  There is 20% stenosis in the body of the diagonal branch and 20% stenosis in the LAD just after the takeoff of the diagonal.  There are luminal irregularities otherwise.  Left circumflex:  The circumflex is a large caliber vessel, which gives rise to a single very large branching obtuse marginal before continuing as a small vessel in the AV groove.  There are tandem 30% stenoses in the body of the obtuse marginal.  Right coronary:  The RCA arises from the right sinus in standard fashion and bifurcates distally into a PDA and right posterolateral ventricular system.  The PDA bifurcates into 2 branches.  There is 30% tubular stenosis proximally and 20% diffuse stenosis in the mid body of the RCA.   IMPRESSION:    Upper normal left ventricular end-diastolic pressure.  Trivial gradient across the aortic valve on pullback.  Nonobstructive coronary artery disease.    Cardiovascular Health Factors  Vitals BP Readings from Last 3 Encounters:   11/27/23 (!) 121/105   11/16/23 (!) 111/97   09/19/23 131/88     Wt Readings from Last 3 Encounters:   11/27/23 89.9 kg (198 lb 3.2 oz)   11/16/23 90.4 kg (199 lb 3.2 oz)  09/19/23 91.1 kg (200 lb 13.4 oz)     BMI Readings from Last 3 Encounters:   11/27/23 32.98 kg/m?   11/16/23 32.15 kg/m?   09/19/23 33.42 kg/m?      Smoking Tobacco Use History[1]   Lipid Profile Cholesterol   Date Value Ref Range Status   09/19/2023 123 <200 mg/dL Final     HDL   Date Value Ref Range Status   09/19/2023 44 >40 mg/dL Final     LDL   Date Value Ref Range Status   09/19/2023 72 <100 mg/dL Final     Triglycerides   Date Value Ref Range Status   09/19/2023 74 <150 mg/dL Final      Blood Sugar No results found for: HGBA1C  Glucose   Date Value Ref Range Status   11/18/2023 96  Final   11/16/2023 105 (H) 70 - 100 mg/dL Final   92/70/7974 887 (H) 70 - 105 Final          Problems Addressed Today  Coronary disease.  Syncope.  Hypertension.  Hypercholesterolemia.    Assessment and Plan   Even though Ms. Rish does not have significant large vessel coronary disease her exertional jaw discomfort could be an anginal equivalent due to angina with nonobstructive coronary disease.  I have asked her to carry sublingual nitroglycerin  for use should she experience any exertional jaw discomfort that is not resolved quickly with rest.  She is also going to see a dentist to see whether her jaw discomfort could be due to temporomandibular joint arthritis. I have asked the patient to keep a log book of her BP readings and to report BP readings exceeding 130/80 mm Hg.  Abstinence from cigarette smoking was strongly recommended.  Alternatives to the treatment of hypercholesterolemia were reviewed with the patient and she wanted to increase her statin dose to 5 mg one day alternating with 10 mg the next day.  Possible adverse effects associated with an increase in statin dose were reviewed with the patient and she was asked to report any myalgias or other worrisome symptoms.  I have asked her to return for follow-up in 6 months time.  Cardiovascular risk factor management was reviewed in detail. The total time spent during this interview and exam with preparation and chart review was 30 minutes.         Current Medications (including today's revisions)   amLODIPine  (NORVASC ) 2.5 mg tablet Take one tablet by mouth twice daily. (Patient taking differently: Take one tablet by mouth daily.)    cholecalciferol (vitamin D3) (VITAMIN D3) 50 mcg (2,000 unit) tablet Take one tablet by mouth daily.    doxylamine succinate (SLEEP AID (DOXYLAMINE)) 25 mg tablet Take one tablet by mouth at bedtime daily.    escitalopram oxalate (LEXAPRO) 10 mg tablet Take one tablet by mouth at bedtime daily.    LORazepam (ATIVAN) 0.5 mg tablet Take one tablet by mouth at bedtime as needed for Nausea.    meloxicam (MOBIC) 15 mg tablet Take one tablet by mouth at bedtime daily.    nitroglycerin  (NITROSTAT ) 0.4 mg tablet Place one tablet under tongue every 5 minutes as needed for Chest Pain. Max of 3 tablets, call 911.    omeprazole DR (PRILOSEC) 20 mg capsule Take one capsule by mouth daily.    ondansetron  (ZOFRAN  ODT) 4 mg rapid dissolve tablet Dissolve one tablet by mouth every 8 hours as needed.    rosuvastatin  (CRESTOR ) 5 mg tablet Take one tablet by mouth daily.  vit C/E/Zn/coppr/lutein/zeaxan (PRESERVISION AREDS-2 PO) Take 2 tablets by mouth daily.                 [1]   Social History  Tobacco Use   Smoking Status Every Day    Current packs/day: 1.00    Types: Cigarettes   Smokeless Tobacco Never

## 2023-11-29 ENCOUNTER — Encounter
Admit: 2023-11-29 | Discharge: 2023-11-29 | Payer: MEDICARE | Primary: Student in an Organized Health Care Education/Training Program

## 2023-12-05 ENCOUNTER — Encounter
Admit: 2023-12-05 | Discharge: 2023-12-05 | Payer: MEDICARE | Primary: Student in an Organized Health Care Education/Training Program

## 2023-12-11 ENCOUNTER — Encounter
Admit: 2023-12-11 | Discharge: 2023-12-11 | Payer: MEDICARE | Primary: Student in an Organized Health Care Education/Training Program

## 2023-12-11 MED ORDER — AMLODIPINE 2.5 MG PO TAB
2.5 mg | ORAL_TABLET | Freq: Every day | ORAL | 1 refills | 90.00000 days | Status: AC
Start: 2023-12-11 — End: ?

## 2023-12-11 NOTE — Telephone Encounter
 12/11/2023 7:30 AM     Received a request via computer from the patients pharmacy requesting a refill.  Script e-scribed as requested.
